# Patient Record
Sex: Male | Born: 1965 | Race: Asian | Hispanic: No | Smoking: Current every day smoker
Health system: Southern US, Community
[De-identification: ages and names within clinical notes are randomized; demographics above are authoritative.]

## PROBLEM LIST (undated history)

## (undated) DIAGNOSIS — G8929 Other chronic pain: Secondary | ICD-10-CM

## (undated) DIAGNOSIS — F419 Anxiety disorder, unspecified: Secondary | ICD-10-CM

## (undated) DIAGNOSIS — K219 Gastro-esophageal reflux disease without esophagitis: Secondary | ICD-10-CM

## (undated) DIAGNOSIS — B1920 Unspecified viral hepatitis C without hepatic coma: Secondary | ICD-10-CM

## (undated) DIAGNOSIS — M519 Unspecified thoracic, thoracolumbar and lumbosacral intervertebral disc disorder: Secondary | ICD-10-CM

## (undated) DIAGNOSIS — K589 Irritable bowel syndrome without diarrhea: Secondary | ICD-10-CM

## (undated) DIAGNOSIS — K259 Gastric ulcer, unspecified as acute or chronic, without hemorrhage or perforation: Secondary | ICD-10-CM

## (undated) DIAGNOSIS — M549 Dorsalgia, unspecified: Secondary | ICD-10-CM

## (undated) DIAGNOSIS — F32A Depression, unspecified: Secondary | ICD-10-CM

## (undated) DIAGNOSIS — F329 Major depressive disorder, single episode, unspecified: Secondary | ICD-10-CM

## (undated) DIAGNOSIS — E559 Vitamin D deficiency, unspecified: Secondary | ICD-10-CM

## (undated) DIAGNOSIS — R519 Headache, unspecified: Secondary | ICD-10-CM

## (undated) DIAGNOSIS — R51 Headache: Secondary | ICD-10-CM

## (undated) HISTORY — PX: INGUINAL HERNIA REPAIR: SUR1180

## (undated) HISTORY — PX: BACK SURGERY: SHX140

## (undated) HISTORY — PX: MULTIPLE TOOTH EXTRACTIONS: SHX2053

---

## 2005-04-13 ENCOUNTER — Encounter: Admission: RE | Admit: 2005-04-13 | Discharge: 2005-04-13 | Payer: Self-pay | Admitting: Unknown Physician Specialty

## 2008-08-26 ENCOUNTER — Emergency Department (HOSPITAL_COMMUNITY): Admission: EM | Admit: 2008-08-26 | Discharge: 2008-08-26 | Payer: Self-pay | Admitting: Family Medicine

## 2008-09-05 ENCOUNTER — Ambulatory Visit: Payer: Self-pay | Admitting: Gastroenterology

## 2008-09-24 ENCOUNTER — Ambulatory Visit: Payer: Self-pay | Admitting: Gastroenterology

## 2008-09-24 ENCOUNTER — Encounter: Payer: Self-pay | Admitting: Internal Medicine

## 2008-10-03 ENCOUNTER — Ambulatory Visit (HOSPITAL_COMMUNITY): Admission: RE | Admit: 2008-10-03 | Discharge: 2008-10-03 | Payer: Self-pay | Admitting: Gastroenterology

## 2008-10-03 ENCOUNTER — Encounter (INDEPENDENT_AMBULATORY_CARE_PROVIDER_SITE_OTHER): Payer: Self-pay | Admitting: Interventional Radiology

## 2008-10-10 ENCOUNTER — Encounter: Payer: Self-pay | Admitting: Internal Medicine

## 2008-10-10 ENCOUNTER — Ambulatory Visit: Payer: Self-pay | Admitting: Internal Medicine

## 2008-10-10 DIAGNOSIS — I10 Essential (primary) hypertension: Secondary | ICD-10-CM

## 2008-10-10 DIAGNOSIS — R079 Chest pain, unspecified: Secondary | ICD-10-CM

## 2008-10-10 LAB — CONVERTED CEMR LAB: Sed Rate: 2 mm/hr (ref 0–16)

## 2008-10-11 DIAGNOSIS — R5383 Other fatigue: Secondary | ICD-10-CM

## 2008-10-11 DIAGNOSIS — R5381 Other malaise: Secondary | ICD-10-CM

## 2008-10-15 ENCOUNTER — Ambulatory Visit (HOSPITAL_COMMUNITY): Admission: RE | Admit: 2008-10-15 | Discharge: 2008-10-15 | Payer: Self-pay | Admitting: Internal Medicine

## 2008-10-24 ENCOUNTER — Encounter: Payer: Self-pay | Admitting: Internal Medicine

## 2008-10-24 ENCOUNTER — Ambulatory Visit: Payer: Self-pay | Admitting: Internal Medicine

## 2008-10-24 LAB — CONVERTED CEMR LAB

## 2008-10-25 ENCOUNTER — Encounter: Payer: Self-pay | Admitting: Internal Medicine

## 2008-11-21 ENCOUNTER — Ambulatory Visit: Payer: Self-pay | Admitting: Infectious Diseases

## 2008-11-25 ENCOUNTER — Encounter: Payer: Self-pay | Admitting: Internal Medicine

## 2008-11-25 ENCOUNTER — Ambulatory Visit: Payer: Self-pay | Admitting: Internal Medicine

## 2008-11-25 LAB — CONVERTED CEMR LAB
Cholesterol: 191 mg/dL (ref 0–200)
HDL: 46 mg/dL (ref >39)
LDL Cholesterol: 116 mg/dL — ABNORMAL HIGH (ref 0–99)
Total CHOL/HDL Ratio: 4.2
Triglycerides: 147 mg/dL (ref <150)
VLDL: 29 mg/dL (ref 0–40)

## 2009-04-10 ENCOUNTER — Ambulatory Visit: Payer: Self-pay | Admitting: Internal Medicine

## 2009-04-10 DIAGNOSIS — K219 Gastro-esophageal reflux disease without esophagitis: Secondary | ICD-10-CM | POA: Insufficient documentation

## 2009-04-10 DIAGNOSIS — R1011 Right upper quadrant pain: Secondary | ICD-10-CM

## 2009-04-11 ENCOUNTER — Telehealth: Payer: Self-pay | Admitting: *Deleted

## 2009-04-15 ENCOUNTER — Ambulatory Visit (HOSPITAL_COMMUNITY): Admission: RE | Admit: 2009-04-15 | Discharge: 2009-04-15 | Payer: Self-pay | Admitting: Gastroenterology

## 2009-06-06 ENCOUNTER — Ambulatory Visit: Payer: Self-pay | Admitting: Gastroenterology

## 2009-06-06 LAB — CONVERTED CEMR LAB
ALT: 56 units/L — ABNORMAL HIGH (ref 0–53)
AST: 31 units/L (ref 0–37)
Albumin: 4 g/dL (ref 3.5–5.2)
Alkaline Phosphatase: 51 units/L (ref 39–117)
BUN: 10 mg/dL (ref 6–23)
CO2: 33 meq/L — ABNORMAL HIGH (ref 19–32)
Calcium: 9.3 mg/dL (ref 8.4–10.5)
Chloride: 102 meq/L (ref 96–112)
Creatinine, Ser: 0.7 mg/dL (ref 0.4–1.5)
GFR calc non Af Amer: 131.21 mL/min (ref >60)
Glucose, Bld: 80 mg/dL (ref 70–99)
Potassium: 4.8 meq/L (ref 3.5–5.1)
Sodium: 140 meq/L (ref 135–145)
Total Bilirubin: 0.4 mg/dL (ref 0.3–1.2)
Total Protein: 7.5 g/dL (ref 6.0–8.3)

## 2009-06-09 ENCOUNTER — Ambulatory Visit: Payer: Self-pay | Admitting: Gastroenterology

## 2009-06-09 LAB — CONVERTED CEMR LAB
Basophils Absolute: 0 10*3/uL (ref 0.0–0.1)
Basophils Relative: 1 % (ref 0–1)
Eosinophils Absolute: 0.1 10*3/uL (ref 0.0–0.7)
Eosinophils Relative: 1 % (ref 0–5)
HCT: 46.8 % (ref 39.0–52.0)
Hemoglobin: 16.2 g/dL (ref 13.0–17.0)
Lymphocytes Relative: 66 % — ABNORMAL HIGH (ref 12–46)
Lymphs Abs: 3.9 10*3/uL (ref 0.7–4.0)
MCHC: 34.6 g/dL (ref 30.0–36.0)
MCV: 75.7 fL — ABNORMAL LOW (ref 78.0–100.0)
Monocytes Absolute: 0.5 10*3/uL (ref 0.1–1.0)
Monocytes Relative: 9 % (ref 3–12)
Neutro Abs: 1.4 10*3/uL — ABNORMAL LOW (ref 1.7–7.7)
Neutrophils Relative %: 24 % — ABNORMAL LOW (ref 43–77)
Platelets: 200 10*3/uL (ref 150–400)
RBC: 6.18 M/uL — ABNORMAL HIGH (ref 4.22–5.81)
RDW: 14.7 % (ref 11.5–15.5)
WBC: 5.9 10*3/uL (ref 4.0–10.5)

## 2009-06-13 ENCOUNTER — Encounter: Payer: Self-pay | Admitting: Gastroenterology

## 2009-07-01 ENCOUNTER — Telehealth: Payer: Self-pay | Admitting: Gastroenterology

## 2009-07-17 ENCOUNTER — Ambulatory Visit: Payer: Self-pay | Admitting: Internal Medicine

## 2009-07-31 ENCOUNTER — Ambulatory Visit: Payer: Self-pay | Admitting: Gastroenterology

## 2009-08-11 ENCOUNTER — Telehealth: Payer: Self-pay | Admitting: Gastroenterology

## 2009-08-12 ENCOUNTER — Ambulatory Visit: Payer: Self-pay | Admitting: Internal Medicine

## 2009-08-12 DIAGNOSIS — K5900 Constipation, unspecified: Secondary | ICD-10-CM | POA: Insufficient documentation

## 2009-08-12 DIAGNOSIS — K2990 Gastroduodenitis, unspecified, without bleeding: Secondary | ICD-10-CM

## 2009-08-12 DIAGNOSIS — R110 Nausea: Secondary | ICD-10-CM | POA: Insufficient documentation

## 2009-08-12 DIAGNOSIS — R1084 Generalized abdominal pain: Secondary | ICD-10-CM | POA: Insufficient documentation

## 2009-08-12 DIAGNOSIS — K2970 Gastritis, unspecified, without bleeding: Secondary | ICD-10-CM | POA: Insufficient documentation

## 2009-09-12 ENCOUNTER — Ambulatory Visit: Payer: Self-pay | Admitting: Gastroenterology

## 2009-11-22 ENCOUNTER — Emergency Department (HOSPITAL_BASED_OUTPATIENT_CLINIC_OR_DEPARTMENT_OTHER): Admission: EM | Admit: 2009-11-22 | Discharge: 2009-11-22 | Payer: Self-pay | Admitting: Emergency Medicine

## 2009-11-22 ENCOUNTER — Ambulatory Visit: Payer: Self-pay | Admitting: Diagnostic Radiology

## 2009-12-09 ENCOUNTER — Ambulatory Visit: Payer: Self-pay | Admitting: Gastroenterology

## 2009-12-15 ENCOUNTER — Telehealth: Payer: Self-pay | Admitting: Gastroenterology

## 2009-12-17 ENCOUNTER — Emergency Department (HOSPITAL_COMMUNITY): Admission: EM | Admit: 2009-12-17 | Discharge: 2009-12-17 | Payer: Self-pay | Admitting: Family Medicine

## 2009-12-27 ENCOUNTER — Ambulatory Visit (HOSPITAL_COMMUNITY): Admission: RE | Admit: 2009-12-27 | Discharge: 2009-12-27 | Payer: Self-pay | Admitting: Unknown Physician Specialty

## 2010-02-07 ENCOUNTER — Emergency Department (HOSPITAL_COMMUNITY): Admission: EM | Admit: 2010-02-07 | Discharge: 2010-02-07 | Payer: Self-pay | Admitting: Emergency Medicine

## 2010-05-30 ENCOUNTER — Encounter: Payer: Self-pay | Admitting: Unknown Physician Specialty

## 2010-06-09 NOTE — Progress Notes (Signed)
Summary: meds not working  Phone Note Call from Patient Call back at [phone]   Caller: Patient Call For: Dr. Christella Hartigan Reason for Call: Talk to Nurse Summary of Call: pt reporting that abx is not working... doesnt recall the name of the drug Initial call taken by: Vallarie Mare,  July 01, 2009 2:39 PM  Follow-up for Phone Call        explained that the med can take up to 6 weeks to work and to call if no better in about 6 weeks.  Prilosec samples left at the front desk. Follow-up by: Chales Abrahams CMA Duncan Dull),  July 01, 2009 3:23 PM

## 2010-06-09 NOTE — Assessment & Plan Note (Signed)
  Review of gastrointestinal problems: 1. H. pylori positive moderat pan-gastritis on EGD January 2011. Treated with Prevpac type antibiotics.  Symptoms returned shortly after he stopped his antibiotics and so another course of antibiotics with Flagyl this time were started. He improved again.    History of Present Illness Visit Type: Follow-up Visit Primary GI MD: Rob Bunting MD Primary Provider: Clerance Lav MD Chief Complaint: abdominal pain History of Present Illness:     very pleasant 45 year old man whom I last saw in January the time of an EGD. See those results above. He was here one month ago, he completed the repeat course of abx (for poss recurrent H. pylori).  He feels definitely better.  He takes a centrum MVi.  Not taking miralax every day because it causes diarrhea.             Current Medications (verified): 1)  Neurontin 300 Mg Caps (Gabapentin) .... Take 1 Tablet By Mouth Two Times A Day 2)  Oxycodone Hcl 30 Mg Tabs (Oxycodone Hcl) .... Take One Tablet 4 Times Daily 3)  Dexilant 60 Mg Cpdr (Dexlansoprazole) .... Take 1 Capsule 30 Min Before Breakfast 4)  Alprazolam 0.5 Mg Tabs (Alprazolam) .... As Needed 5)  Multivitamins  Tabs (Multiple Vitamin) .... Once Daily  Allergies (verified): No Known Drug Allergies  Vital Signs:  Patient profile:   45 year old male Height:      67.5 inches Weight:      160 pounds BMI:     24.78 Pulse rate:   64 / minute Pulse rhythm:   regular BP sitting:   108 / 82  (left arm) Cuff size:   regular  Vitals Entered By: June McMurray CMA Duncan Dull) (Sep 12, 2009 3:12 PM)  Physical Exam  Additional Exam:  Constitutional: generally well appearing Psychiatric: alert and oriented times 3 Abdomen: soft, non-tender, non-distended, normal bowel sounds    Impression & Recommendations:  Problem # 1:  dyspepsia, H. pylori gastritis his symptoms have definitely improved since this second round of antibiotics to treat his H.  pylori he, biopsies confirmed. I explained there is still probably irritation, damage to his stomach to can take several more weeks to he'll. We have given samples of antiacid medicine I told him to take over-the-counter Prilosec once daily for now.  Patient Instructions: 1)  Samples of PPI given. 2)  Start OTC prilosec (take one pill, once a day, best taken 20-30 min  before  BF or dinner meal. 3)  Call Dr. Christella Hartigan' office as needed. 4)  The medication list was reviewed and reconciled.  All changed / newly prescribed medications were explained.  A complete medication list was provided to the patient / caregiver.  Appended Document:  prilosec samples Samples Given #6   -Lot Number:  [id number]

## 2010-06-09 NOTE — Progress Notes (Signed)
Summary: triage  Phone Note Call from Patient Call back at Home Phone [phone]   Caller: Patient Call For: Christella Hartigan Reason for Call: Talk to Nurse Summary of Call: Patient states that he is having a lot of stomach pain and was told to call us today if it did not go away with meds given to him, states that this weekeng the pain was severe. Initial call taken by: Tawni Levy,  August 11, 2009 9:22 AM  Follow-up for Phone Call        pt was schedueld with Amy for 08/12/09.   Follow-up by: Chales Abrahams CMA Duncan Dull),  August 11, 2009 9:56 AM

## 2010-06-09 NOTE — Assessment & Plan Note (Signed)
Summary: ROUTINE CK/EST/VS   Vital Signs:  Patient profile:   45 year old male Height:      67.5 inches (171.45 cm) Weight:      160.7 pounds (73.05 kg) BMI:     24.89 Temp:     98.2 degrees F oral Pulse rate:   90 / minute BP sitting:   125 / 75  (right arm)  Vitals Entered By: Chinita Pester RN (July 17, 2009 2:44 PM) CC: Pain right side which radiates to neck  and  head. Is Patient Diabetic? No Pain Assessment Patient in pain? yes     Location: neck/head Intensity: 5/6 Type: "SWELLING PAIN" Onset of pain  Intermittent Nutritional Status BMI of 19 -24 = normal  Have you ever been in a relationship where you felt threatened, hurt or afraid?No   Does patient need assistance? Functional Status Self care Ambulation Normal   Primary Care Provider:  Clerance Lav MD  CC:  Pain right side which radiates to neck  and  head.Marland Kitchen  History of Present Illness: 45 years old [place] men with past history of Hep C- 37,600 viral load, normal LFT, AFP of 3.3 (09/05/08) again presents with chronic severe pain in right back ribcage area and complain of weakness in both lower extremities upon waking up in the morning.  He continues to experience similar type of pain since last visit. He takes pain medications but needs it every few hours constantly. He does not like narcotic dependence but takes them off and on.  He was back in [place] but did not get any surgical intervention as he had planned earlier. He also has lot of financial strain.   He has been thinking about surgery and wants my opinion regarding the matter. He also wants to discuss alternative therapies as well as narcotic dependence.   Depression History:      The patient denies a depressed mood most of the day and a diminished interest in his usual daily activities.         Preventive Screening-Counseling & Management  Alcohol-Tobacco     Alcohol drinks/day: 0     Smoking Status: current     Smoking Cessation Counseling:  yes     Packs/Day: <1.0  Caffeine-Diet-Exercise     Does Patient Exercise: no  Current Medications (verified): 1)  Omeprazole 20 Mg Tbec (Omeprazole) .... Take 1 Tablet By Mouth Two Times A Day 2)  Neurontin 300 Mg Caps (Gabapentin) .... Take 1 Tablet By Mouth Two Times A Day 3)  Oxycodone Hcl 30 Mg Tabs (Oxycodone Hcl) .... Take One Tablet 4 Times Daily  Allergies (verified): No Known Drug Allergies  Past History:  Past Medical History: Last updated: 06/06/2009 Hep C- liver biopsy done HIV negative Chronic neck pain- parsthesias- likely had vertebral fusion at the age of 29 Hypertension    Past Surgical History: Last updated: 06/06/2009 vertebral fusion in neck  Family History: Last updated: 06/06/2009 Father had liver problem and experienced severe pain in the liver area, died in [place], no particular diagnosis known Mother alive has chronic health problems- not details available Siblings are doing fine One child healthy   Social History: Last updated: 06/06/2009 Recently divorced, still lives with ex-wife, trying to move out. Works at [employer] for [employer] 3/4 pack a day for last 20 years  does not drink alcohol  Risk Factors: Alcohol Use: 0 (07/17/2009) Exercise: no (07/17/2009)  Risk Factors: Smoking Status: current (07/17/2009) Packs/Day: <1.0 (07/17/2009)  Social  History: Packs/Day:  <1.0  Review of Systems      See HPI  Physical Exam  General:  Well-developed,well-nourished,in no acute distress; alert,appropriate and cooperative throughout examination Head:  Normocephalic and atraumatic without obvious abnormalities. No apparent alopecia or balding. Eyes:  No corneal or conjunctival inflammation noted. EOMI. Perrla. Vision grossly normal. Ears:  no external deformities.   Nose:  no external erythema.   Mouth:  pharynx pink and moist.   Neck:  No deformities, masses, or tenderness noted. no tingling or numbness upon cervical rib  exam- no cervical rib felt.  Lungs:  Normal respiratory effort, chest expands symmetrically. Lungs are clear to auscultation, no crackles or wheezes. Heart:  Normal rate and regular rhythm. S1 and S2 normal without gallop, murmur, click, rub or other extra sounds. Abdomen:  Bowel sounds positive,abdomen soft and non-tender without masses,   or hernias noted. ?enlarged liver edge Msk:  No deformity or scoliosis noted of thoracic or lumbar spine.  Other joints normal ROM, no erythema, no restriction in motion.  Generalised tenderness.  Neurologic:  No cranial nerve deficits noted. Station and gait are normal. Plantar reflexes are down-going bilaterally. DTRs are symmetrical throughout. Sensory, motor and coordinative functions appear intact. Skin:  Intact without suspicious lesions or rashes Psych:  Cognition and judgment appear intact. Alert and cooperative with normal attention span and concentration. No apparent delusions, illusions, hallucinations   Impression & Recommendations:  Problem # 1:  RIB PAIN (ICD-786.50) chronic pain which has been investigated with xray, usg and physical exam on multiple occassions and remains without etiology. He reports no releif in this particular type of pain even with oxycodone that releives his neck pain.  At this time, I do not think further investigation is warranted. We discussed possibility of referred pain and exam failed to reveal any particular etiology.  Problem # 2:  NECK PAIN, CHRONIC (ICD-723.1) Pt reports adequate releif with narcotics. I discussed his previous surgeries and their failure to resolve symptoms. Also he wants surgery done in [place], and it is difficult to assess what kind of surgery he might get and what outcome he should get. I recommended that he stay away from surgery for now and deal with chronic pain with help of pain clinic.  The following medications were removed from the medication list:    Tramadol Hcl 50 Mg Tabs (Tramadol  hcl) .Marland KitchenMarland KitchenMarland KitchenMarland Kitchen 50 mg every six hours as needed for pain  Problem # 3:  ESSENTIAL HYPERTENSION, BENIGN (ICD-401.1) BP normal right now. Previous high readings were likely secondary to pain. Not on any medicines.  BP today: 125/75 Prior BP: 120/80 (06/06/2009)  Labs Reviewed: K+: 4.8 (06/06/2009) Creat: : 0.7 (06/06/2009)   Chol: 191 (11/25/2008)   HDL: 46 (11/25/2008)   LDL: 116 (11/25/2008)   TG: 147 (11/25/2008)  Complete Medication List: 1)  Omeprazole 20 Mg Tbec (Omeprazole) .... Take 1 tablet by mouth two times a day 2)  Neurontin 300 Mg Caps (Gabapentin) .... Take 1 tablet by mouth two times a day 3)  Oxycodone Hcl 30 Mg Tabs (oxycodone Hcl)  .... Take one tablet 4 times daily  Patient Instructions: 1)  Please schedule a follow-up appointment in 6 months.   Prevention & Chronic Care Immunizations   Influenza vaccine: Not documented    Tetanus booster: 11/21/2008: Td    Pneumococcal vaccine: Not documented  Other Screening   Smoking status: current  (07/17/2009)   Smoking cessation counseling: yes  (07/17/2009)  Lipids   Total Cholesterol:  191  (11/25/2008)   Lipid panel action/deferral: Lipid Panel ordered   LDL: 116  (11/25/2008)   LDL Direct: Not documented   HDL: 46  (11/25/2008)   Triglycerides: 147  (11/25/2008)  Hypertension   Last Blood Pressure: 125 / 75  (07/17/2009)   Serum creatinine: 0.7  (06/06/2009)   Serum potassium 4.8  (06/06/2009)  Self-Management Support :   Personal Goals (by the next clinic visit) :      Personal blood pressure goal: 140/90  (11/21/2008)   Patient will work on the following items until the next clinic visit to reach self-care goals:     Medications and monitoring: check my blood pressure  (07/17/2009)     Eating: eat more vegetables, use fresh or frozen vegetables, eat foods that are low in salt  (11/21/2008)     Activity: take a 30 minute walk every day  (07/17/2009)    Hypertension self-management support: Education  handout, Resources for patients handout  (07/17/2009)   Hypertension education handout printed      Resource handout printed.

## 2010-06-09 NOTE — Procedures (Signed)
Summary: Upper Endoscopy  Patient: Brandon Ali Note: All result statuses are Final unless otherwise noted.  Tests: (1) Upper Endoscopy (EGD)   EGD Upper Endoscopy       DONE     [facility]     [address]     [place], [place]  [address]           ENDOSCOPY PROCEDURE REPORT           PATIENT:  Brandon Ali, Brandon Ali  MR#:  [id number]     BIRTHDATE:  [birth year], 43 yrs. old  GENDER:  male           ENDOSCOPIST:  Rachael Fee, MD     Referred by:  Jarvis Newcomer, MD           PROCEDURE DATE:  06/09/2009     PROCEDURE:  EGD with biopsy     ASA CLASS:  Class II     INDICATIONS:  abd pain, dyspepsia           MEDICATIONS:   Fentanyl 50 mcg IV, Versed 5 mg IV     TOPICAL ANESTHETIC:  Exactacain Spray           DESCRIPTION OF PROCEDURE:   After the risks benefits and     alternatives of the procedure were thoroughly explained, informed     consent was obtained.  The LB [id number] endoscope was     introduced through the mouth and advanced to the second portion of     the duodenum, without limitations.  The instrument was slowly     withdrawn as the mucosa was fully examined.     <<PROCEDUREIMAGES>>           There was mild to moderate pan-gastritis. Biopsies were taken to     check for H. pylori (pathology jar 1) (see image6, image7, and     image8).  A nodule was found. There was a small (1cm),     umbilicated, subepithelial nodule in distal stomach. Biopsies were     taken (jar 2) (see image5 and image4).  Otherwise the examination     was normal (see image3, image2, and image1).    Retroflexed views     revealed no abnormalities.    The scope was then withdrawn from     the patient and the procedure completed.           COMPLICATIONS:  None           ENDOSCOPIC IMPRESSION:     1) Moderate gastritis, biopsied to check for H. pylori.     2) Small, umbilicated distal stomach nodule.  Biopsies taken.     3) Otherwise normal examination           RECOMMENDATIONS:     If  biopsies show H. pylori, then he will be started on the     appropriate antibiotics.  If not, will have to consider further     imaging (perhaps HIDA scan given RUQ nature of his pains).           ______________________________     Rachael Fee, MD           n.     eSIGNED:   Rachael Fee at 06/09/2009 02:22 PM           Lucy Antigua, [id number]  Note: An exclamation mark (!) indicates a result that was not dispersed into the flowsheet. Document Creation Date: 06/09/2009  2:22 PM _______________________________________________________________________  (1) Order result status: Final Collection or observation date-time: 06/09/2009 14:16 Requested date-time:  Receipt date-time:  Reported date-time:  Referring Physician:   Ordering Physician: Rob Bunting [phone]) Specimen Source:  Source: Launa Grill Order Number: [id number] Lab site:

## 2010-06-09 NOTE — Assessment & Plan Note (Signed)
History of Present Illness Visit Type: consult Primary GI MD: Rob Bunting MD Primary Provider: Clerance Lav MD Chief Complaint: GERD, RUQ pain History of Present Illness:     very pleasant 45 year old [place] born man who has had problems for at least 5 years.  He has AM swelling of abd, bloated.  Also facial swelling.  He get epigastric pain, usually after eating 2-3 times a week. The pain can last .  He has taken pepcid for it, will often help.  prevacid did not really help.  He was given a liquid at an urgent care and he thinks it helped. The pain feels like a little ball is stuck.   He also has a right sided abd pains.  he has chronic hepatitis C which he has been told is not active. He had a recent abdominal ultrasound showing a normal liver, normal gallbladder. She is scheduled to see medical specialty clinic to consider treating his hepatitis C  he has been on omeprazole daily for about a month without change.  he has mouth ulcers at time.  water feels like it goes down esophagus slowly.  Weight is overall stable.  he takes oxycodone 15mg  5 times a day for neck pains. he has been on this for 4 years or so.  Also on neuronitic for this pain.  Very rare NSAIDs.   No vomitting of blood.             Current Medications (verified): 1)  Tramadol Hcl 50 Mg Tabs (Tramadol Hcl) .... 50 Mg Every Six Hours As Needed For Pain 2)  Omeprazole 20 Mg Tbec (Omeprazole) .... Take 1 Tablet By Mouth Two Times A Day 3)  Neurontin 300 Mg Caps (Gabapentin) .... Take 1 Tablet By Mouth Two Times A Day 4)  Oxycodone Hcl 15 Mg Tabs (Oxycodone Hcl) .... Take One Tablet 5 Times Daily  Allergies (verified): No Known Drug Allergies  Past History:  Past Medical History: Hep C- liver biopsy done HIV negative Chronic neck pain- parsthesias- likely had vertebral fusion at the age of 59 Hypertension    Past Surgical History: vertebral fusion in neck  Family History: Father had  liver problem and experienced severe pain in the liver area, died in [place], no particular diagnosis known Mother alive has chronic health problems- not details available Siblings are doing fine One child healthy   Social History: Recently divorced, still lives with ex-wife, trying to move out. Works at [employer] for [employer] 3/4 pack a day for last 20 years  does not drink alcohol  Review of Systems       Pertinent positive and negative review of systems were noted in the above HPI and GI specific review of systems.  All other review of systems was otherwise negative.   Vital Signs:  Patient profile:   45 year old male Height:      67.5 inches Weight:      159 pounds BMI:     24.62 Pulse rate:   84 / minute Pulse rhythm:   regular BP sitting:   120 / 80  (left arm) Cuff size:   regular  Vitals Entered By: Francee Piccolo CMA Duncan Dull) (June 06, 2009 3:09 PM)  Physical Exam  Additional Exam:  Constitutional: generally well appearing Psychiatric: alert and oriented times 3 Eyes: extraocular movements intact Mouth: oropharynx moist, no lesions Neck: supple, no lymphadenopathy Cardiovascular: heart regular rate and rythm Lungs: CTA bilaterally Abdomen: soft, non-tender, non-distended, no obvious  ascites, no peritoneal signs, normal bowel sounds Extremities: no lower extremity edema bilaterally Skin: no lesions on visible extremities    Impression & Recommendations:  Problem # 1:  epigastric discomfort GERD-like symptoms, right upper quadrant discomfort this may be biliary in nature however no gallstones were seen on ultrasound recently and his gallbladder was otherwise normal. Perhaps he has peptic ulcer disease, gastritis, GERD associated dyspepsia. He has tried antiacid medicines in the past however I will give him samples of another prescription strength antiacid medicine which she will take one pill 20 to 30s prior to his dinner meal daily. We will set  him up with EGD at his soonest convenience and he will get repeat labs including a CBC and a complete metabolic profile.  Other Orders: TLB-CBC Platelet - w/Differential (85025-CBCD) TLB-CMP (Comprehensive Metabolic Pnl) (80053-COMP)  Patient Instructions: 1)  You will get lab test(s) done today (cbc, cmet). 2)  You will be scheduled to have an upper endoscopy. 3)  Samples of PPI given, take one pill 20-30 min before dinner meal daily. 4)  A copy of this information will be sent to Dr. Sherryll Burger. 5)  The medication list was reviewed and reconciled.  All changed / newly prescribed medications were explained.  A complete medication list was provided to the patient / caregiver.   Appended Document: Orders Update/EGD    Clinical Lists Changes  Orders: Added new Test order of EGD (EGD) - Signed

## 2010-06-09 NOTE — Miscellaneous (Signed)
Summary: H pylori  Clinical Lists Changes  Medications: Changed medication from [facility]  MISC (AMOXICILL-CLARITHRO-LANSOPRAZ) as directed to AMOXICILLIN 500 MG CAPS (AMOXICILLIN) 2 by mouth two times a day  x 10 days - Signed Added new medication of CLARITHROMYCIN 500 MG TABS (CLARITHROMYCIN) 1 by mouth two times a day x 10 days - Signed Rx of AMOXICILLIN 500 MG CAPS (AMOXICILLIN) 2 by mouth two times a day  x 10 days;  #40 x 0;  Signed;  Entered by: Chales Abrahams CMA (AAMA);  Authorized by: Rachael Fee MD;  Method used: Electronically to [facility] [phone]*, [address] [place], [place]  [address], Ph: [phone], Fax: [phone] Rx of CLARITHROMYCIN 500 MG TABS (CLARITHROMYCIN) 1 by mouth two times a day x 10 days;  #20 x 0;  Signed;  Entered by: Chales Abrahams CMA (AAMA);  Authorized by: Rachael Fee MD;  Method used: Electronically to [facility] [phone]*, [address], [place], [place]  [address], Ph: [phone], Fax: [phone]    Prescriptions: CLARITHROMYCIN 500 MG TABS (CLARITHROMYCIN) 1 by mouth two times a day x 10 days  #20 x 0   Entered by:   Chales Abrahams CMA (AAMA)   Authorized by:   Rachael Fee MD   Signed by:   Chales Abrahams CMA (AAMA) on 06/13/2009   Method used:   Electronically to        [facility] [phone]* (retail)       [address]       [place], [place]  [address]       Ph: [phone]       Fax: [phone]   RxID:   [id number] AMOXICILLIN 500 MG CAPS (AMOXICILLIN) 2 by mouth two times a day  x 10 days  #40 x 0   Entered by:   Chales Abrahams CMA (AAMA)   Authorized by:   Rachael Fee MD   Signed by:   Chales Abrahams CMA (AAMA) on 06/13/2009   Method used:   Electronically to        [facility] [phone]* (retail)       [address]       [place], [place]  [address]       Ph: [phone]       Fax: [phone]   RxID:   [id number]   Appended Document: H pylori pt could not afford the Prevpac.  pt was instructed to take the  omeprazole as well two times a day

## 2010-06-09 NOTE — Assessment & Plan Note (Signed)
Summary: worsening abdominal pain/pl     Brandon Ali pt   History of Present Illness Visit Type: Follow-up Visit Primary GI MD: Rob Bunting MD Primary Provider: Clerance Lav MD Chief Complaint: Patient complains of worsening abdominal pain, he also states that he has some constipation.  History of Present Illness:   43 [place] MALE KNOWN TO DR. [name] . PT HAD EVAL EARLIER THIS YEAR FOR EPIGASTRIC PAIN,BURNING AND WAS FOUND TO HAVE CHRONIC GASTRITIS ON EGD. BX + FOR H.PYLORI . HE WAS TREATED WITH BIAXIN, AND AMOXICILLIN X 10 DAYS,IN ADDITION TO OMEPRAZOLE TWICE DAILY X 10 DAYS. HE SAYS HE FELT ALOT BETTER WITH THAT REGIMEN,AND HIS BOWELS MOVED BETTER AS WELL. HE COMES IN NOW WITH 2 WEEKS OF RECURRENT DISCOMFORT,RATHER GENERALIZED,WITH BLOATING,UPPER ABDOMINAL BURNING. HE IS ALSO CONSTIPATED-SAYS HE HAS PELLET LIKE STOOLS.Marland KitchenHE IS CONVINCED HE NEEDS THE ANTIBIOTICS AGAIN.   GI Review of Systems    Reports abdominal pain, bloating, and  nausea.     Location of  Abdominal pain: generalized.    Denies acid reflux, belching, chest pain, dysphagia with liquids, dysphagia with solids, heartburn, loss of appetite, vomiting, vomiting blood, and  weight loss.      Reports constipation.     Denies anal fissure, black tarry stools, change in bowel habit, diarrhea, diverticulosis, fecal incontinence, heme positive stool, hemorrhoids, irritable bowel syndrome, jaundice, light color stool, liver problems, rectal bleeding, and  rectal pain.    Current Medications (verified): 1)  Omeprazole 20 Mg Tbec (Omeprazole) .... Take One By Mouth As Needed 2)  Neurontin 300 Mg Caps (Gabapentin) .... Take 1 Tablet By Mouth Two Times A Day 3)  Oxycodone Hcl 30 Mg Tabs (Oxycodone Hcl) .... Take One Tablet 4 Times Daily  Allergies (verified): No Known Drug Allergies  Past History:  Past Medical History: Hep C- liver biopsy done HIV negative Chronic neck pain- parasthesias- likely had vertebral fusion at the age of  85 Hypertension   chronic gastritis/h.pylori 2/11  Past Surgical History: Reviewed history from 06/06/2009 and no changes required. vertebral fusion in neck  Family History: Reviewed history from 06/06/2009 and no changes required. Father had liver problem and experienced severe pain in the liver area, died in [place], no particular diagnosis known Mother alive has chronic health problems- not details available Siblings are doing fine One child healthy   Social History: Reviewed history from 06/06/2009 and no changes required. Recently divorced, still lives with ex-wife, trying to move out. Works at [employer] for [employer] 3/4 pack a day for last 20 years  does not drink alcohol  Review of Systems       The patient complains of fever, muscle pains/cramps, shortness of breath, sleeping problems, and swelling of feet/legs.  The patient denies allergy/sinus, anemia, anxiety-new, arthritis/joint pain, back pain, blood in urine, breast changes/lumps, change in vision, confusion, cough, coughing up blood, depression-new, fainting, fatigue, headaches-new, hearing problems, heart murmur, heart rhythm changes, itching, menstrual pain, night sweats, nosebleeds, pregnancy symptoms, skin rash, sore throat, swollen lymph glands, thirst - excessive , urination - excessive , urination changes/pain, urine leakage, vision changes, and voice change.         ros otherwise as in hpi  Vital Signs:  Patient profile:   45 year old male Height:      67.5 inches Weight:      159.0 pounds BMI:     24.62 Pulse rate:   80 / minute Pulse rhythm:   regular BP sitting:   110 /  80  (left arm) Cuff size:   regular  Vitals Entered By: Harlow Mares CMA Duncan Dull) (August 12, 2009 1:38 PM)  Physical Exam  General:  Well developed, well nourished, no acute distress. Head:  Normocephalic and atraumatic. Eyes:  PERRLA, no icterus. Lungs:  Clear throughout to auscultation. Heart:  Regular rate and  rhythm; no murmurs, rubs,  or bruits. Abdomen:  SOFT, NO FOCAL TENDERNESS, NO MASS OR HSM,BS+ Rectal:  NOT DONE Extremities:  No clubbing, cyanosis, edema or deformities noted. Neurologic:  Alert and  oriented x4;  grossly normal neurologically. Psych:  Alert and cooperative. Normal mood and affect.anxious.     Impression & Recommendations:  Problem # 1:  ABDOMINAL PAIN -GENERALIZED (ICD-789.07) Assessment Deteriorated 45 YO MALE WITH RECENT RX FOR H.PYLORI ,CHRONIC GASTRITIS;WITH RECURRENT SXS, AND IN ADDITION CHRONIC CONSTIPATION-NARCOTIC INDUCED. I AM NOT CONVINCED HE HAS PERSISTENT H.PYLORI,BUT MAY HAVE PERSISTENT GASTRITIS   WILL ALLOW 10 DAY COURSE OF FLAGYL 500 MG TWICE DAILY X 10 DAYS,AND AMOXICILLIN 500MG  TWICE DAILY X 10 DAYS TRIAL OF DEXILANT 60 MG ONCE DAILY X 3 WEEKS-SAMPLES PROVIDED MIRALAX 17 GM DAILY IN WATER -EVERY DAY FOLLOW UP WITH DR. [name] IN 3-4 WEEKS.  Problem # 2:  HEPATITIS C (ICD-070.51) Assessment: Comment Only BEING FOLLOWED AT HEP C CLINIC  Problem # 3:  NECK PAIN, CHRONIC (ICD-723.1) Assessment: Comment Only CHRONIC NARCOTIC USE,AGGRAVATING GI SXS.  Patient Instructions: 1)  We sent prescriptions for Flagyl and Amoxicillin to your pharmacy. 2)  Take Miralax daily, samples and coupons daily. 3)  We have given you samples of Dexilant, take 1 capsule 30 min prior to breakfast. 4)  Make appointment to see Dr. [name] in 4 weeks.  5)  Copy sent to : Toniann Ket, MD 6)  The medication list was reviewed and reconciled.  All changed / newly prescribed medications were explained.  A complete medication list was provided to the patient / caregiver. Prescriptions: AMOXICILLIN 500 MG CAPS (AMOXICILLIN) Tale 2 tab twice daily x 10 days  #20 x 0   Entered by:   Lowry Ram NCMA   Authorized by:   Sammuel Cooper PA-c   Signed by:   Lowry Ram NCMA on 08/12/2009   Method used:   Electronically to        [facility] [phone]* (retail)       [address]       [place], [place]  [address]       Ph: [phone]       Fax: [phone]   RxID:   [id number] FLAGYL 500 MG TABS (METRONIDAZOLE) Take 1 tab twice daily  #20 x 0   Entered by:   Lowry Ram NCMA   Authorized by:   Sammuel Cooper PA-c   Signed by:   Lowry Ram NCMA on 08/12/2009   Method used:   Electronically to        [facility] [phone]* (retail)       [address]       [place], [place]  [address]       Ph: [phone]       Fax: [phone]   RxID:   [id number]

## 2010-06-09 NOTE — Assessment & Plan Note (Signed)
Review of gastrointestinal problems: 1. H. pylori positive moderat pan-gastritis on EGD January 2011. Treated with Prevpac type antibiotics.  Symptoms returned shortly after he stopped his antibiotics and so another course of antibiotics with Flagyl this time were started. He improved again.  History of Present Illness:      very pleasant 45 year old man whom I last saw several months ago.  has been having pains in right flank.  The pain is constant.  The right flank pain is improved with antibiotics.  HE wen tto ER 2 weeks ago for the pain.  His primary care physician has evaluated this flank pain several times. Imaging studies have been unrevealing. Ultrasound shows a normal gallbladder.           Current Medications (verified): 1)  Neurontin 300 Mg Caps (Gabapentin) .... Take 1 Tablet By Mouth Two Times A Day 2)  Oxycodone Hcl 30 Mg Tabs (Oxycodone Hcl) .... Take One Tablet 4 Times Daily 3)  Aciphex 20 Mg Tbec (Rabeprazole Sodium) .... Take 1 Tablet By Mouth Once A Day 4)  Alprazolam 0.5 Mg Tabs (Alprazolam) .... As Needed 5)  Multivitamins  Tabs (Multiple Vitamin) .... Once Daily 6)  Ranitidine Hcl 150 Mg Caps (Ranitidine Hcl) .... Take 1 Capsule By Mouth Once A Day As Needed  Allergies (verified): No Known Drug Allergies  Vital Signs:  Patient profile:   45 year old male Height:      67.5 inches Weight:      150 pounds BMI:     23.23 Pulse rate:   76 / minute Pulse rhythm:   regular BP sitting:   108 / 74  (left arm) Cuff size:   regular  Vitals Entered By: Brandon Ali CMA Brandon Ali) (December 09, 2009 1:47 PM)  Physical Exam  Additional Exam:  Constitutional: generally well appearing Psychiatric: alert and oriented times 3 Abdomen: soft, non-tender, non-distended, normal bowel sounds    Impression & Recommendations:  Problem # 1:  hepatitis C he was supposed to have a visit at the medical specialties clinic earlier this spring however they canceled that  appointment. Perhaps some of his right sided abdominal, flank pains are related to his chronic hepatitis. We will call the specialties clinic to see if we can get him back in.  Problem # 2:  dyspepsia he continues to have intermittent stomach discomforts. He swears that antibiotics clearly helped his discomforts. He has been treated twice for H. pylori. I'll try him on Carafate and he will return to see me in 6 weeks.  Patient Instructions: 1)  We will call the medical specialties clinic about hepatitis C referal. Was supposed to be seen this past spring.  2)  Carafate trial, called into your pharmacy.  Take twice daily. 3)  Return to see Brandon Ali in 6 weeks. 4)  The medication list was reviewed and reconciled.  All changed / newly prescribed medications were explained.  A complete medication list was provided to the patient / caregiver. Prescriptions: CARAFATE 1 GM/10ML  SUSP (SUCRALFATE) take 1 gram twice daily  #1 month x 3   Entered and Authorized by:   Brandon Fee MD   Signed by:   Brandon Fee MD on 12/09/2009   Method used:   Electronically to        [facility] [phone]* (retail)       [address]       [place], [place]  [address]       Ph: [phone]  Fax: [phone]   RxID:   [id number]   Appended Document:  called the Hep c clinic numerous times the phone just rings I will try later  Appended Document:  no answer and unable to leave a message  Appended Document: Orders Update/Hep C  [phone] new number   Clinical Lists Changes  Orders: Added new Test order of [facility]) - Signed      Appended Document:  left message on machine to call back   Appended Document:  left message on machine to call back

## 2010-06-09 NOTE — Progress Notes (Signed)
Summary: liver specialist referral?  Phone Note Call from Patient Call back at [phone]   Caller: [place], wife Call For: Dr. Christella Hartigan Reason for Call: Talk to Nurse Summary of Call: wife following up on referral to liver specialist Initial call taken by: Vallarie Mare,  December 15, 2009 12:53 PM  Follow-up for Phone Call        explained to the wife that I have sent the records to the Hep B clinic and have not gotten a response.  I have also placed numerous calls with no response.  I will continue to work on the appt and call back. Follow-up by: Chales Abrahams CMA Duncan Dull),  December 15, 2009 1:19 PM     Appended Document: liver specialist referral? refaxed all records and referral form  Appended Document: liver specialist referral? pt is on a wait list for appt

## 2010-06-09 NOTE — Letter (Signed)
Summary: EGD Instructions  [facility]  [address] [place], [place] [address]   Phone: [phone]  Fax: [phone]       Brandon Ali    May 05, 1966    MRN: [id number]       Procedure Day /Date:06/09/09     Arrival Time: 1:00 pm     Procedure Time:2:00 pm     Location of Procedure:                    [facility] (4th Floor)    PREPARATION FOR ENDOSCOPY   On1/31/11 THE DAY OF THE PROCEDURE:  1.   No solid foods, milk or milk products are allowed after midnight the night before your procedure.  2.   Do not drink anything colored red or purple.  Avoid juices with pulp.  No orange juice.  3.  You may drink clear liquids until12 noon, which is 2 hours before your procedure.                                                                                                CLEAR LIQUIDS INCLUDE: Water Jello Ice Popsicles Tea (sugar ok, no milk/cream) Powdered fruit flavored drinks Coffee (sugar ok, no milk/cream) Gatorade Juice: apple, white grape, white cranberry  Lemonade Clear bullion, consomm, broth Carbonated beverages (any kind) Strained chicken noodle soup Hard Candy   MEDICATION INSTRUCTIONS  Unless otherwise instructed, you should take regular prescription medications with a small sip of water as early as possible the morning of your procedure.               OTHER INSTRUCTIONS  You will need a responsible adult at least 44 years of age to accompany you and drive you home.   This person must remain in the waiting room during your procedure.  Wear loose fitting clothing that is easily removed.  Leave jewelry and other valuables at home.  However, you may wish to bring a book to read or an iPod/MP3 player to listen to music as you wait for your procedure to start.  Remove all body piercing jewelry and leave at home.  Total time from sign-in until discharge is approximately 2-3 hours.  You should go home directly after your  procedure and rest.  You can resume normal activities the day after your procedure.  The day of your procedure you should not:   Drive   Make legal decisions   Operate machinery   Drink alcohol   Return to work  You will receive specific instructions about eating, activities and medications before you leave.    The above instructions have been reviewed and explained to me by   _______________________    I fully understand and can verbalize these instructions _____________________________ Date _________

## 2010-07-23 LAB — URINALYSIS, ROUTINE W REFLEX MICROSCOPIC
Bilirubin Urine: NEGATIVE
Glucose, UA: NEGATIVE mg/dL
Hgb urine dipstick: NEGATIVE
Ketones, ur: NEGATIVE mg/dL
Nitrite: NEGATIVE
Protein, ur: NEGATIVE mg/dL
Specific Gravity, Urine: 1.004 — ABNORMAL LOW (ref 1.005–1.030)
Urobilinogen, UA: 0.2 mg/dL (ref 0.0–1.0)
pH: 7 (ref 5.0–8.0)

## 2010-07-23 LAB — DIFFERENTIAL
Basophils Absolute: 0 10*3/uL (ref 0.0–0.1)
Basophils Relative: 0 % (ref 0–1)
Eosinophils Absolute: 0 10*3/uL (ref 0.0–0.7)
Eosinophils Relative: 0 % (ref 0–5)
Lymphocytes Relative: 55 % — ABNORMAL HIGH (ref 12–46)
Lymphs Abs: 2.7 10*3/uL (ref 0.7–4.0)
Monocytes Absolute: 0.4 10*3/uL (ref 0.1–1.0)
Monocytes Relative: 7 % (ref 3–12)
Neutro Abs: 1.9 10*3/uL (ref 1.7–7.7)
Neutrophils Relative %: 37 % — ABNORMAL LOW (ref 43–77)

## 2010-07-23 LAB — POCT I-STAT, CHEM 8
BUN: 7 mg/dL (ref 6–23)
Calcium, Ion: 1.16 mmol/L (ref 1.12–1.32)
Chloride: 99 mEq/L (ref 96–112)
Creatinine, Ser: 0.8 mg/dL (ref 0.4–1.5)
Glucose, Bld: 92 mg/dL (ref 70–99)
HCT: 52 % (ref 39.0–52.0)
Hemoglobin: 17.7 g/dL — ABNORMAL HIGH (ref 13.0–17.0)
Potassium: 4.7 mEq/L (ref 3.5–5.1)
Sodium: 135 mEq/L (ref 135–145)
TCO2: 29 mmol/L (ref 0–100)

## 2010-07-23 LAB — LIPASE, BLOOD: Lipase: 23 U/L (ref 11–59)

## 2010-07-23 LAB — CBC
HCT: 45.5 % (ref 39.0–52.0)
Hemoglobin: 16 g/dL (ref 13.0–17.0)
MCH: 26.4 pg (ref 26.0–34.0)
MCHC: 35.2 g/dL (ref 30.0–36.0)
MCV: 75.1 fL — ABNORMAL LOW (ref 78.0–100.0)
Platelets: 210 10*3/uL (ref 150–400)
RBC: 6.06 MIL/uL — ABNORMAL HIGH (ref 4.22–5.81)
RDW: 14.4 % (ref 11.5–15.5)
WBC: 5 10*3/uL (ref 4.0–10.5)

## 2010-07-25 LAB — BASIC METABOLIC PANEL
BUN: 10 mg/dL (ref 6–23)
CO2: 29 mEq/L (ref 19–32)
Calcium: 9.2 mg/dL (ref 8.4–10.5)
Chloride: 101 mEq/L (ref 96–112)
Creatinine, Ser: 0.8 mg/dL (ref 0.4–1.5)
GFR calc Af Amer: >60 mL/min (ref >60)
GFR calc non Af Amer: >60 mL/min (ref >60)
Glucose, Bld: 91 mg/dL (ref 70–99)
Potassium: 4 mEq/L (ref 3.5–5.1)
Sodium: 138 mEq/L (ref 135–145)

## 2010-07-25 LAB — DIFFERENTIAL
Basophils Absolute: 0.1 10*3/uL (ref 0.0–0.1)
Basophils Relative: 1 % (ref 0–1)
Eosinophils Absolute: 0.1 10*3/uL (ref 0.0–0.7)
Eosinophils Relative: 2 % (ref 0–5)
Lymphocytes Relative: 63 % — ABNORMAL HIGH (ref 12–46)
Lymphs Abs: 3.7 10*3/uL (ref 0.7–4.0)
Monocytes Absolute: 0.4 10*3/uL (ref 0.1–1.0)
Monocytes Relative: 7 % (ref 3–12)
Neutro Abs: 1.6 10*3/uL — ABNORMAL LOW (ref 1.7–7.7)
Neutrophils Relative %: 27 % — ABNORMAL LOW (ref 43–77)

## 2010-07-25 LAB — CBC
HCT: 45.4 % (ref 39.0–52.0)
Hemoglobin: 14.6 g/dL (ref 13.0–17.0)
MCH: 25.7 pg — ABNORMAL LOW (ref 26.0–34.0)
MCHC: 32.3 g/dL (ref 30.0–36.0)
MCV: 79.5 fL (ref 78.0–100.0)
Platelets: 194 10*3/uL (ref 150–400)
RBC: 5.71 MIL/uL (ref 4.22–5.81)
RDW: 14 % (ref 11.5–15.5)
WBC: 5.9 10*3/uL (ref 4.0–10.5)

## 2010-07-25 LAB — POCT CARDIAC MARKERS
CKMB, poc: <1 ng/mL — ABNORMAL LOW (ref 1.0–8.0)
Myoglobin, poc: 46.5 ng/mL (ref 12–200)
Troponin i, poc: <0.05 ng/mL (ref 0.00–0.09)

## 2010-07-25 LAB — URINALYSIS, ROUTINE W REFLEX MICROSCOPIC
Bilirubin Urine: NEGATIVE
Glucose, UA: NEGATIVE mg/dL
Hgb urine dipstick: NEGATIVE
Ketones, ur: NEGATIVE mg/dL
Nitrite: NEGATIVE
Protein, ur: NEGATIVE mg/dL
Specific Gravity, Urine: 1.017 (ref 1.005–1.030)
Urobilinogen, UA: 1 mg/dL (ref 0.0–1.0)
pH: 7 (ref 5.0–8.0)

## 2010-08-18 LAB — PROTIME-INR
INR: 1.1 (ref 0.00–1.49)
Prothrombin Time: 14.4 seconds (ref 11.6–15.2)

## 2010-08-18 LAB — CBC
HCT: 45.3 % (ref 39.0–52.0)
Hemoglobin: 14.9 g/dL (ref 13.0–17.0)
MCHC: 33 g/dL (ref 30.0–36.0)
MCV: 79.9 fL (ref 78.0–100.0)
Platelets: 175 10*3/uL (ref 150–400)
RBC: 5.67 MIL/uL (ref 4.22–5.81)
RDW: 14.6 % (ref 11.5–15.5)
WBC: 4.6 10*3/uL (ref 4.0–10.5)

## 2010-08-19 LAB — COMPREHENSIVE METABOLIC PANEL
ALT: 43 U/L (ref 0–53)
AST: 26 U/L (ref 0–37)
CO2: 25 mEq/L (ref 19–32)
Calcium: 9.7 mg/dL (ref 8.4–10.5)
Chloride: 103 mEq/L (ref 96–112)
Creatinine, Ser: 0.75 mg/dL (ref 0.4–1.5)
GFR calc Af Amer: >60 mL/min (ref >60)
GFR calc non Af Amer: >60 mL/min (ref >60)
Glucose, Bld: 111 mg/dL — ABNORMAL HIGH (ref 70–99)
Total Bilirubin: 0.5 mg/dL (ref 0.3–1.2)

## 2010-08-19 LAB — DIFFERENTIAL
Basophils Absolute: 0 10*3/uL (ref 0.0–0.1)
Eosinophils Absolute: 0.1 10*3/uL (ref 0.0–0.7)
Eosinophils Relative: 1 % (ref 0–5)
Lymphs Abs: 2.9 10*3/uL (ref 0.7–4.0)
Neutrophils Relative %: 39 % — ABNORMAL LOW (ref 43–77)

## 2010-08-19 LAB — CBC
Hemoglobin: 16.6 g/dL (ref 13.0–17.0)
MCHC: 33.7 g/dL (ref 30.0–36.0)
MCV: 79.6 fL (ref 78.0–100.0)
RBC: 6.19 MIL/uL — ABNORMAL HIGH (ref 4.22–5.81)
WBC: 5.5 10*3/uL (ref 4.0–10.5)

## 2010-08-19 LAB — LIPASE, BLOOD: Lipase: 20 U/L (ref 11–59)

## 2011-02-06 DIAGNOSIS — M542 Cervicalgia: Secondary | ICD-10-CM | POA: Insufficient documentation

## 2011-02-06 DIAGNOSIS — F329 Major depressive disorder, single episode, unspecified: Secondary | ICD-10-CM | POA: Insufficient documentation

## 2011-02-06 DIAGNOSIS — B182 Chronic viral hepatitis C: Secondary | ICD-10-CM | POA: Insufficient documentation

## 2011-04-06 ENCOUNTER — Emergency Department (INDEPENDENT_AMBULATORY_CARE_PROVIDER_SITE_OTHER): Payer: Self-pay

## 2011-04-06 ENCOUNTER — Emergency Department (HOSPITAL_BASED_OUTPATIENT_CLINIC_OR_DEPARTMENT_OTHER)
Admission: EM | Admit: 2011-04-06 | Discharge: 2011-04-07 | Disposition: A | Discharge status: Home / Self Care | Payer: Self-pay | Attending: Emergency Medicine | Admitting: Emergency Medicine

## 2011-04-06 ENCOUNTER — Encounter: Payer: Self-pay | Admitting: Emergency Medicine

## 2011-04-06 DIAGNOSIS — R1011 Right upper quadrant pain: Secondary | ICD-10-CM | POA: Insufficient documentation

## 2011-04-06 DIAGNOSIS — R112 Nausea with vomiting, unspecified: Secondary | ICD-10-CM

## 2011-04-06 DIAGNOSIS — F17200 Nicotine dependence, unspecified, uncomplicated: Secondary | ICD-10-CM | POA: Insufficient documentation

## 2011-04-06 DIAGNOSIS — K4090 Unilateral inguinal hernia, without obstruction or gangrene, not specified as recurrent: Secondary | ICD-10-CM

## 2011-04-06 DIAGNOSIS — K589 Irritable bowel syndrome without diarrhea: Secondary | ICD-10-CM | POA: Insufficient documentation

## 2011-04-06 DIAGNOSIS — K2970 Gastritis, unspecified, without bleeding: Secondary | ICD-10-CM | POA: Insufficient documentation

## 2011-04-06 DIAGNOSIS — R1013 Epigastric pain: Secondary | ICD-10-CM | POA: Insufficient documentation

## 2011-04-06 DIAGNOSIS — R141 Gas pain: Secondary | ICD-10-CM

## 2011-04-06 DIAGNOSIS — R109 Unspecified abdominal pain: Secondary | ICD-10-CM

## 2011-04-06 HISTORY — DX: Irritable bowel syndrome, unspecified: K58.9

## 2011-04-06 HISTORY — DX: Unspecified viral hepatitis C without hepatic coma: B19.20

## 2011-04-06 HISTORY — DX: Unspecified thoracic, thoracolumbar and lumbosacral intervertebral disc disorder: M51.9

## 2011-04-06 LAB — URINALYSIS, ROUTINE W REFLEX MICROSCOPIC
Bilirubin Urine: NEGATIVE
Ketones, ur: NEGATIVE mg/dL
Leukocytes, UA: NEGATIVE
Nitrite: NEGATIVE
Protein, ur: NEGATIVE mg/dL
Urobilinogen, UA: 0.2 mg/dL (ref 0.0–1.0)
pH: 6 (ref 5.0–8.0)

## 2011-04-06 LAB — COMPREHENSIVE METABOLIC PANEL
AST: 17 U/L (ref 0–37)
Albumin: 3.9 g/dL (ref 3.5–5.2)
Alkaline Phosphatase: 46 U/L (ref 39–117)
BUN: 7 mg/dL (ref 6–23)
Chloride: 97 mEq/L (ref 96–112)
Potassium: 4.3 mEq/L (ref 3.5–5.1)
Sodium: 132 mEq/L — ABNORMAL LOW (ref 135–145)
Total Protein: 7.7 g/dL (ref 6.0–8.3)

## 2011-04-06 LAB — CBC
MCH: 25.9 pg — ABNORMAL LOW (ref 26.0–34.0)
MCHC: 34.9 g/dL (ref 30.0–36.0)
Platelets: 246 10*3/uL (ref 150–400)
RDW: 14.3 % (ref 11.5–15.5)

## 2011-04-06 LAB — DIFFERENTIAL
Basophils Absolute: 0 10*3/uL (ref 0.0–0.1)
Basophils Relative: 0 % (ref 0–1)
Eosinophils Absolute: 0.1 10*3/uL (ref 0.0–0.7)
Lymphocytes Relative: 62 % — ABNORMAL HIGH (ref 12–46)
Monocytes Absolute: 0.4 10*3/uL (ref 0.1–1.0)
Neutrophils Relative %: 30 % — ABNORMAL LOW (ref 43–77)

## 2011-04-06 LAB — LIPASE, BLOOD: Lipase: 21 U/L (ref 11–59)

## 2011-04-06 MED ORDER — HYDROMORPHONE HCL PF 1 MG/ML IJ SOLN
1.0000 mg | Freq: Once | INTRAMUSCULAR | Status: AC
  Administered 2011-04-06: 1 mg via INTRAVENOUS
  Filled 2011-04-06: qty 1

## 2011-04-06 MED ORDER — ONDANSETRON HCL 4 MG/2ML IJ SOLN
4.0000 mg | Freq: Once | INTRAMUSCULAR | Status: AC
  Administered 2011-04-06: 4 mg via INTRAVENOUS
  Filled 2011-04-06: qty 2

## 2011-04-06 MED ORDER — PANTOPRAZOLE SODIUM 40 MG IV SOLR
40.0000 mg | Freq: Once | INTRAVENOUS | Status: AC
  Administered 2011-04-06: 40 mg via INTRAVENOUS
  Filled 2011-04-06: qty 40

## 2011-04-06 MED ORDER — IOHEXOL 300 MG/ML  SOLN
100.0000 mL | Freq: Once | INTRAMUSCULAR | Status: AC | PRN
  Administered 2011-04-06: 100 mL via INTRAVENOUS

## 2011-04-06 MED ORDER — SODIUM CHLORIDE 0.9 % IV BOLUS (SEPSIS)
1000.0000 mL | Freq: Once | INTRAVENOUS | Status: AC
  Administered 2011-04-06: 1000 mL via INTRAVENOUS

## 2011-04-06 MED ORDER — GI COCKTAIL ~~LOC~~
30.0000 mL | Freq: Once | ORAL | Status: AC
  Administered 2011-04-06: 30 mL via ORAL
  Filled 2011-04-06: qty 30

## 2011-04-06 NOTE — ED Notes (Signed)
Pt had Hepatitis C treatment at [facility], finished 02/25/11

## 2011-04-06 NOTE — ED Provider Notes (Signed)
History     CSN: [id number] Arrival date & time: 04/06/2011  9:19 PM   First MD Initiated Contact with Patient 04/06/11 2128      Chief Complaint  Patient presents with  . Flank Pain  . Abdominal Pain    (Consider location/radiation/quality/duration/timing/severity/associated sxs/prior treatment) HPI Comments: Patient presents with the referral weeks of intermittent left upper quadrant as well as epigastric pain associated with nausea.  She is accompanied to gastroenterological history including gastritis is been treated twice for H. pylori hepatitis C. He underwent a hepatitis C treatment at [facility] in early October and states his symptoms have been intermittent and worsening since then. He was told hepatitis C levels are undetectable. He is on dexilant and for acid reflux. He came in tonight because the pain is worsening.  He denies any fevers, weight loss, night sweats, chest pain or shortness of breath. States he is moving his bowels normally. He does endorse some burning with urination moreso at the conclusion of urination. Denies any testicular pain or penile discharge. He seen Dr. Christella Hartigan in the past and GI. He denies any alcohol or NSAID use.  The history is provided by the patient.    Past Medical History  Diagnosis Date  . Hepatitis C   . Ruptured disk   . IBS (irritable bowel syndrome)     Past Surgical History  Procedure Date  . Back surgery   . Hernia repair     No family history on file.  History  Substance Use Topics  . Smoking status: Current Everyday Smoker  . Smokeless tobacco: Not on file  . Alcohol Use: No      Review of Systems  Constitutional: Positive for activity change and fatigue. Negative for fever.  Respiratory: Negative for cough, choking and shortness of breath.   Cardiovascular: Negative for chest pain.  Gastrointestinal: Positive for nausea, vomiting and abdominal pain.  Genitourinary: Positive for flank pain. Negative for dysuria,  hematuria and testicular pain.  Musculoskeletal: Negative for back pain.  Skin: Negative for rash.  Neurological: Negative for dizziness, weakness and headaches.    Allergies  Aspirin and Cabbage  Home Medications   Current Outpatient Rx  Name Route Sig Dispense Refill  . ALPRAZOLAM 0.5 MG PO TABS Oral Take 0.5 mg by mouth 2 (two) times daily.      . AMOXICILLIN 500 MG PO CAPS Oral Take 1,000 mg by mouth 2 (two) times daily.      Marland Kitchen BISMUTH SUBSALICYLATE 262 MG/15ML PO SUSP Oral Take 15 mLs by mouth every 6 (six) hours as needed. For IBS     . CITALOPRAM HYDROBROMIDE 20 MG PO TABS Oral Take 20 mg by mouth daily.      . DEXLANSOPRAZOLE 60 MG PO CPDR Oral Take 60 mg by mouth daily.      . IBUPROFEN 200 MG PO TABS Oral Take 600 mg by mouth every 6 (six) hours as needed. For pain     . METHADONE HCL 10 MG PO TABS Oral Take 10 mg by mouth 3 (three) times daily.      Marland Kitchen ONE-DAILY MULTI VITAMINS PO TABS Oral Take 1 tablet by mouth daily.      Marland Kitchen OVER THE COUNTER MEDICATION Oral Take 2 tablets by mouth once. ROWATINEX ([place] Medication) for renal disorders, infections of the urinary tract and urolithiasis     . OXYCODONE HCL 30 MG PO TABS Oral Take 30 mg by mouth 4 (four) times daily.      Marland Kitchen  AZO TABS PO Oral Take 1 tablet by mouth 3 (three) times daily.      Marland Kitchen POLYETHYLENE GLYCOL 3350 PO POWD Oral Take 17 g by mouth daily.        BP 133/84  Pulse 74  Temp(Src) 98.1 F (36.7 C) (Oral)  Resp 18  SpO2 100%  Physical Exam  Constitutional: He is oriented to person, place, and time. He appears well-developed. No distress.  HENT:  Head: Normocephalic and atraumatic.  Mouth/Throat: Oropharynx is clear and moist. No oropharyngeal exudate.  Eyes: Conjunctivae are normal. Pupils are equal, round, and reactive to light.  Neck: Normal range of motion.  Cardiovascular: Normal rate, regular rhythm and normal heart sounds.   Pulmonary/Chest: Effort normal and breath sounds normal. No respiratory  distress.  Abdominal: Soft. There is tenderness.       Left upper quadrant epigastric tenderness  Genitourinary: No penile tenderness.       Normal external genitalia, no testicular pain  Musculoskeletal: Normal range of motion. He exhibits no edema and no tenderness.  Neurological: He is alert and oriented to person, place, and time. No cranial nerve deficit.  Skin: Skin is warm.    ED Course  Procedures (including critical care time)  Labs Reviewed  CBC - Abnormal; Notable for the following:    MCV 74.4 (*)    MCH 25.9 (*)    All other components within normal limits  DIFFERENTIAL - Abnormal; Notable for the following:    Neutrophils Relative 30 (*)    Lymphocytes Relative 62 (*)    Neutro Abs 1.6 (*)    All other components within normal limits  COMPREHENSIVE METABOLIC PANEL - Abnormal; Notable for the following:    Sodium 132 (*)    Total Bilirubin 0.1 (*)    All other components within normal limits  URINALYSIS, ROUTINE W REFLEX MICROSCOPIC  LIPASE, BLOOD   Ct Abdomen Pelvis W Contrast  04/06/2011  *RADIOLOGY REPORT*  Clinical Data: Mid abdominal pain, distension, nausea and vomiting.  CT ABDOMEN AND PELVIS WITH CONTRAST  Technique:  Multidetector CT imaging of the abdomen and pelvis was performed following the standard protocol during bolus administration of intravenous contrast.  Contrast: OMNIPAQUE IOHEXOL 300 MG/ML IV SOLN  Comparison: CT of the abdomen and pelvis performed 02/07/2010  Findings: The visualized lung bases are clear.  The liver and spleen are unremarkable in appearance.  The gallbladder is within normal limits.  The pancreas and adrenal glands are unremarkable.  The kidneys are unremarkable in appearance.  There is no evidence of hydronephrosis.  No renal or ureteral stones are seen.  No perinephric stranding is appreciated.  No free fluid is identified.  The small bowel is unremarkable in appearance.  The stomach is within normal limits.  No acute  vascular abnormalities are seen.  A retroaortic left renal vein is incidentally noted.  The appendix is normal in caliber and contains air, without evidence for appendicitis.  The colon is partially filled with stool and is unremarkable in appearance.  The bladder is mildly distended and appears grossly unremarkable. The prostate remains normal in size.  A small left inguinal hernia is noted, containing only fat.  No inguinal lymphadenopathy is seen.  No acute osseous abnormalities are identified.  IMPRESSION:  1.  No acute abnormalities identified within the abdomen or pelvis. 2.  Small left inguinal hernia, containing only fat.  Original Report Authenticated By: Tonia Ghent, M.D.     1. Abdominal pain   2.  Gastritis       MDM  Upper abdominal pain with history of gastritis as well as hepatitis C.  Vital signs are stable patient is nontoxic-appearing on exam.  LFTs and lipase are normal. Urinalysis is normal. CT scan does not show any acute abnormalities within the abdomen or pelvis.  Patient said that it improved with Zofran GI cocktail is likely a component of gastritis or acid reflux. He is to follow up with the gastroenterology for probable endoscopy        Glynn Octave, MD 04/07/11 [phone]

## 2011-04-06 NOTE — ED Notes (Signed)
Pt c/o right flank pain and left upper quadrant abd pain. Pt has had nausea and some vomiting.

## 2011-04-22 DIAGNOSIS — K219 Gastro-esophageal reflux disease without esophagitis: Secondary | ICD-10-CM | POA: Insufficient documentation

## 2011-07-21 IMAGING — CT CT ABD-PELV W/O CM
2 of 4 series · 17 of 46 positions shown, 19 images · non-contrast
Comparison: None.

CLINICAL DATA: Right side abdominal pain.

CT ABDOMEN AND PELVIS WITHOUT CONTRAST
TECHNIQUE: Multidetector CT imaging of the abdomen and pelvis was
performed following the standard protocol without intravenous
contrast.

[Series 3: stone study · axial · 0.67mm/px · z∈[-722,-317]mm · 14 of 89 slices shown, 16 images]
[im 4/89  soft-tissue]
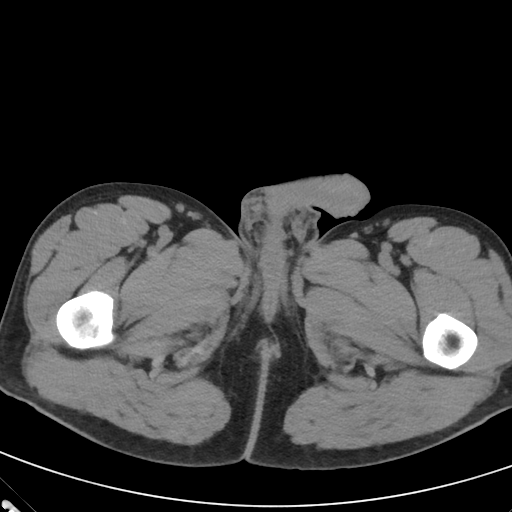
[im 4/89  bone]
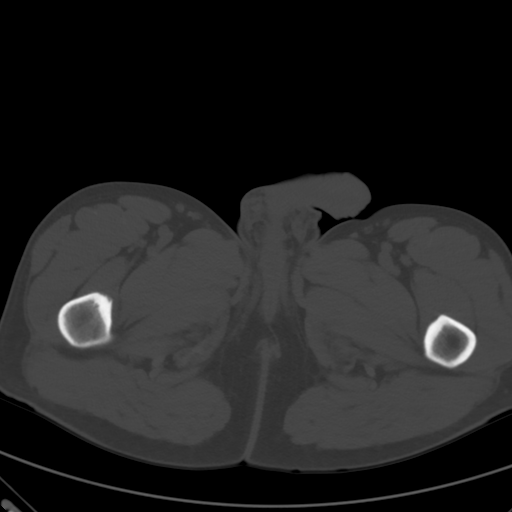
[im 12/89  soft-tissue]
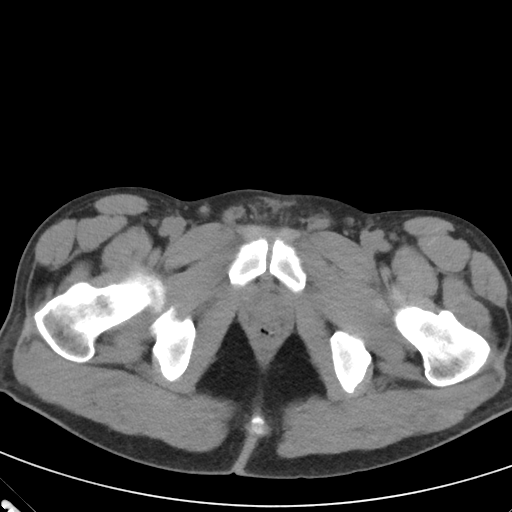
[im 19/89  soft-tissue]
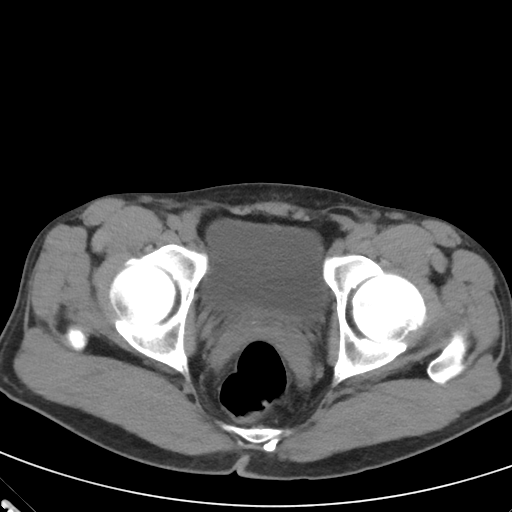
[im 23/89  soft-tissue]
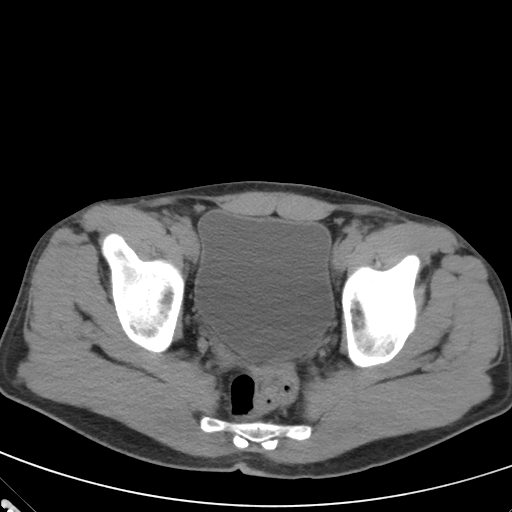
[im 30/89  soft-tissue]
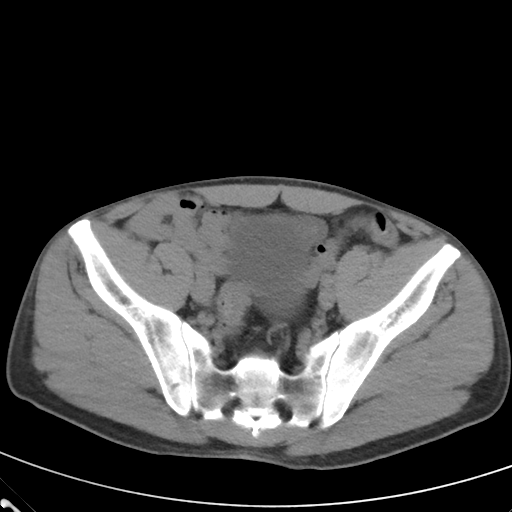
[im 37/89  soft-tissue]
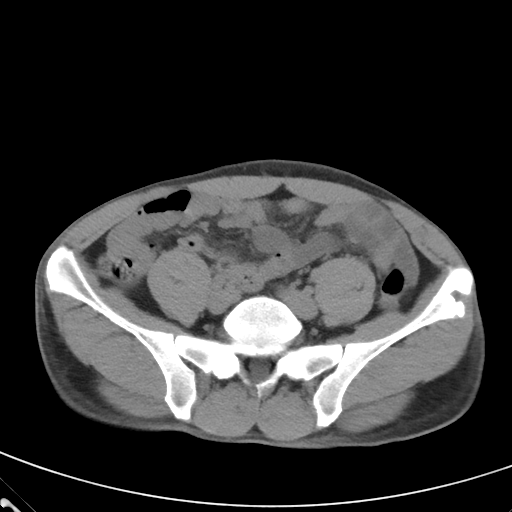
[im 41/89  soft-tissue]
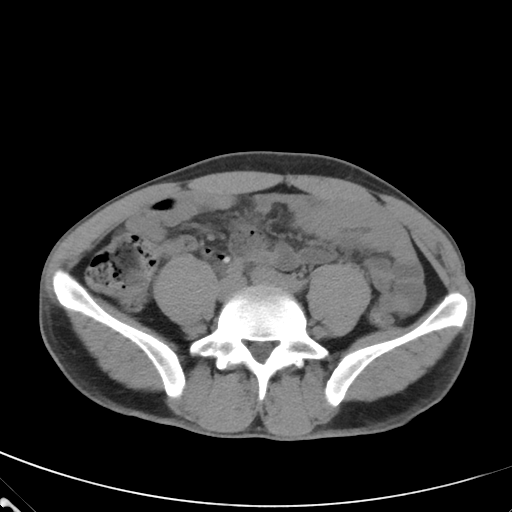
[im 48/89  soft-tissue]
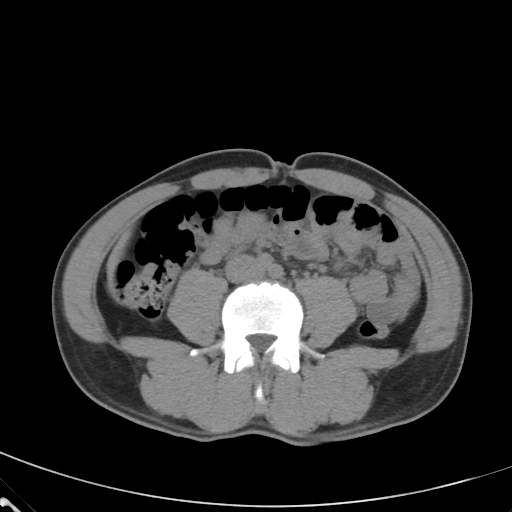
[im 52/89  soft-tissue]
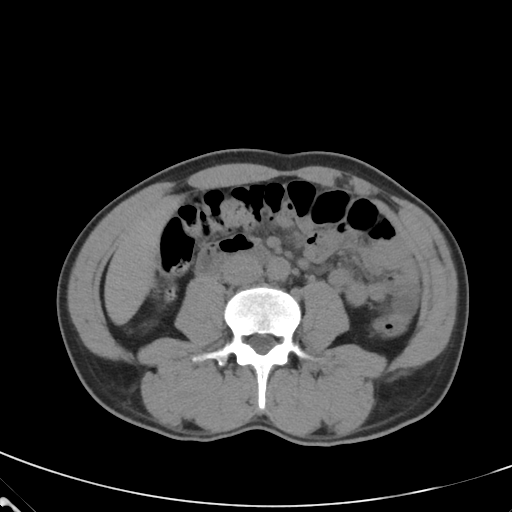
[im 52/89  bone]
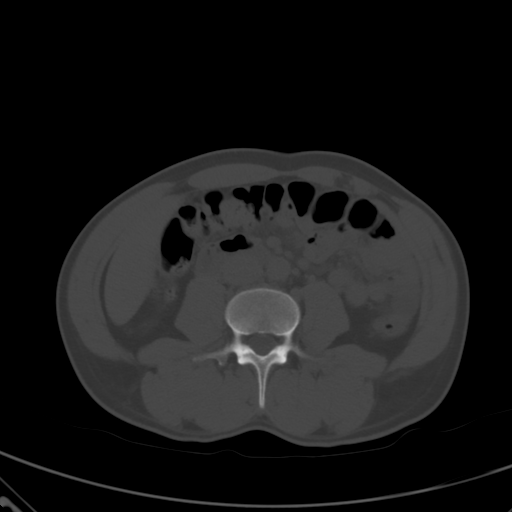
[im 59/89  soft-tissue]
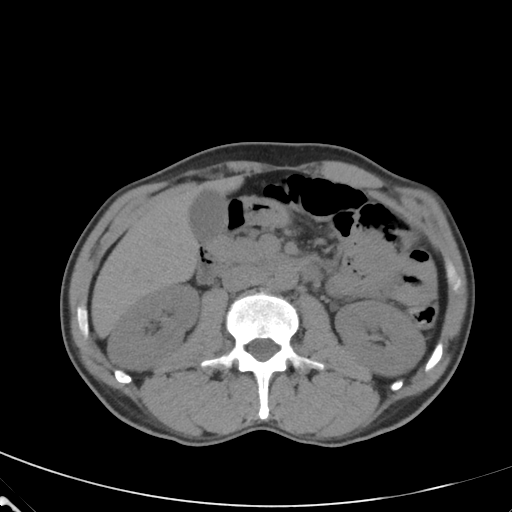
[im 67/89  soft-tissue]
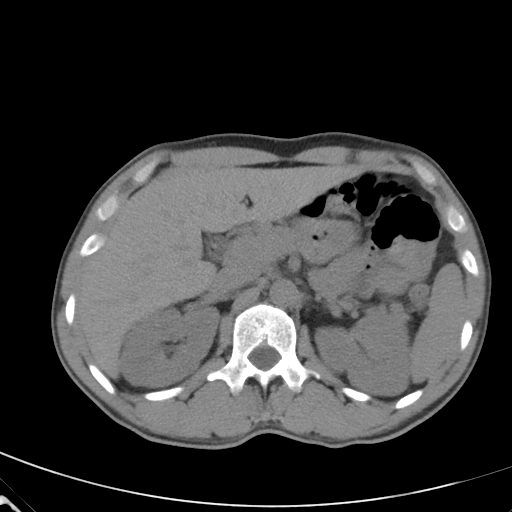
[im 70/89  soft-tissue]
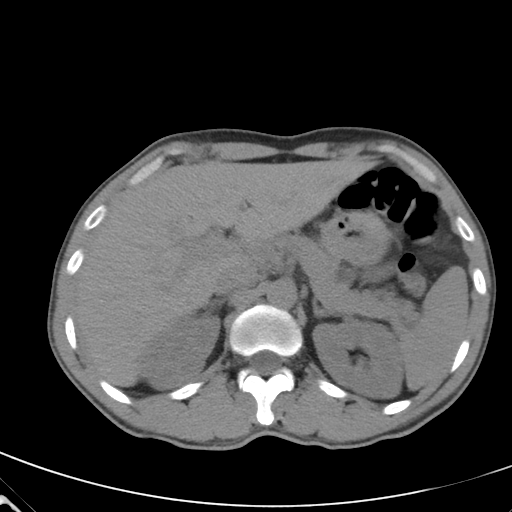
[im 78/89  soft-tissue]
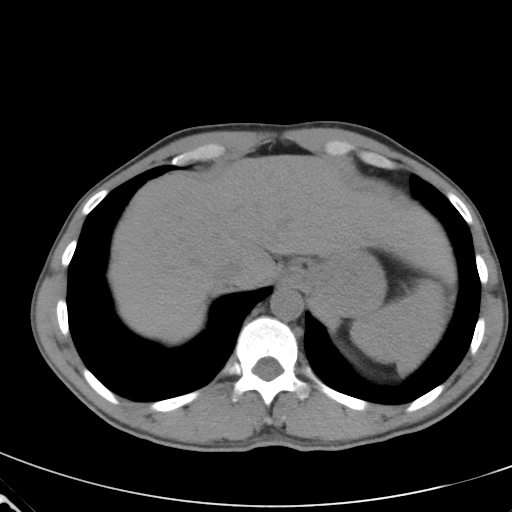
[im 85/89  soft-tissue]
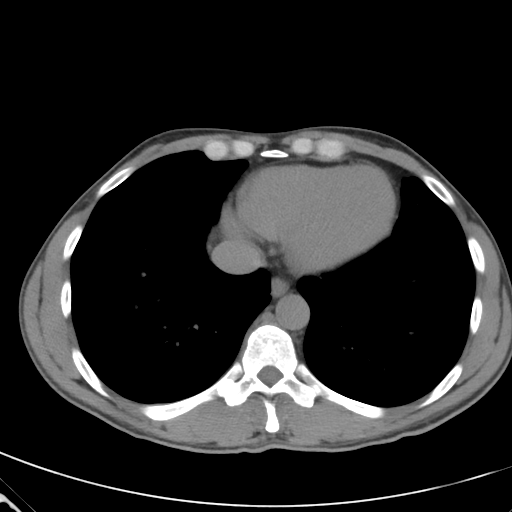

[cor · coronal · 0.86mm/px · 3 of 59 slices shown]
[im 20/59  soft-tissue]
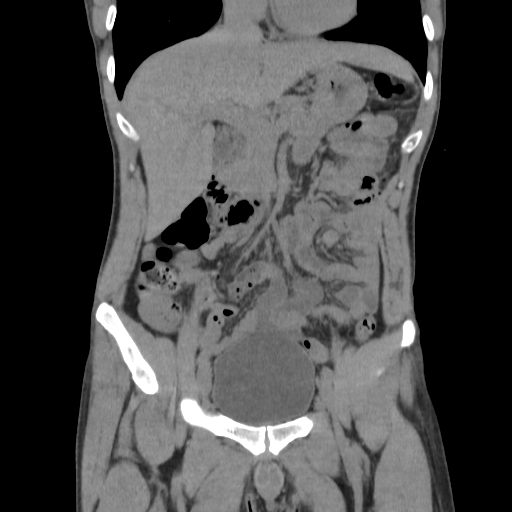
[im 26/59  soft-tissue]
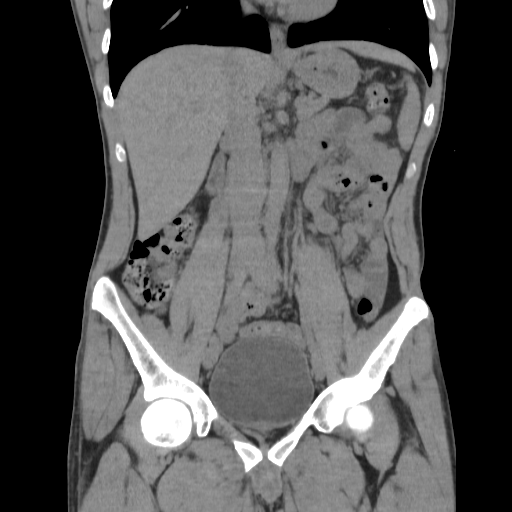
[im 33/59  soft-tissue]
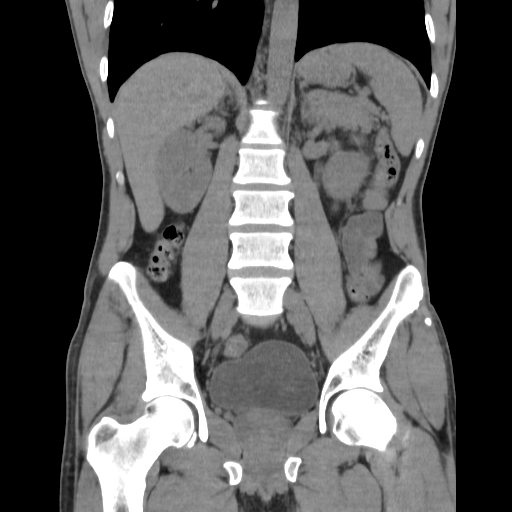

[17 of 46 positions shown; findings below may reference images not displayed]

FINDINGS: The lung bases are clear.  No pleural or pericardial
effusion.

The kidneys have a normal uninfused appearance bilaterally.  No
renal or ureteral stones or hydronephrosis.  The liver,
gallbladder, spleen, adrenal glands and pancreas all appear normal.
There is high attenuation material within the appendix but no
periappendiceal inflammatory change or dilatation is identified.
There is no pelvic fluid.  No lymphadenopathy is seen.  The stomach
and small and large bowel appear normal.  No focal bony
abnormality.
IMPRESSION: Negative urinary tract stone.  No acute finding or finding to
explain the patient's symptoms.

## 2011-07-21 IMAGING — CR DG CHEST 2V
2 series · 2 of 2 positions shown · non-contrast
Comparison: 11/22/2009

CLINICAL DATA: Right-sided chest pain

CHEST - 2 VIEW

[w chest pa]
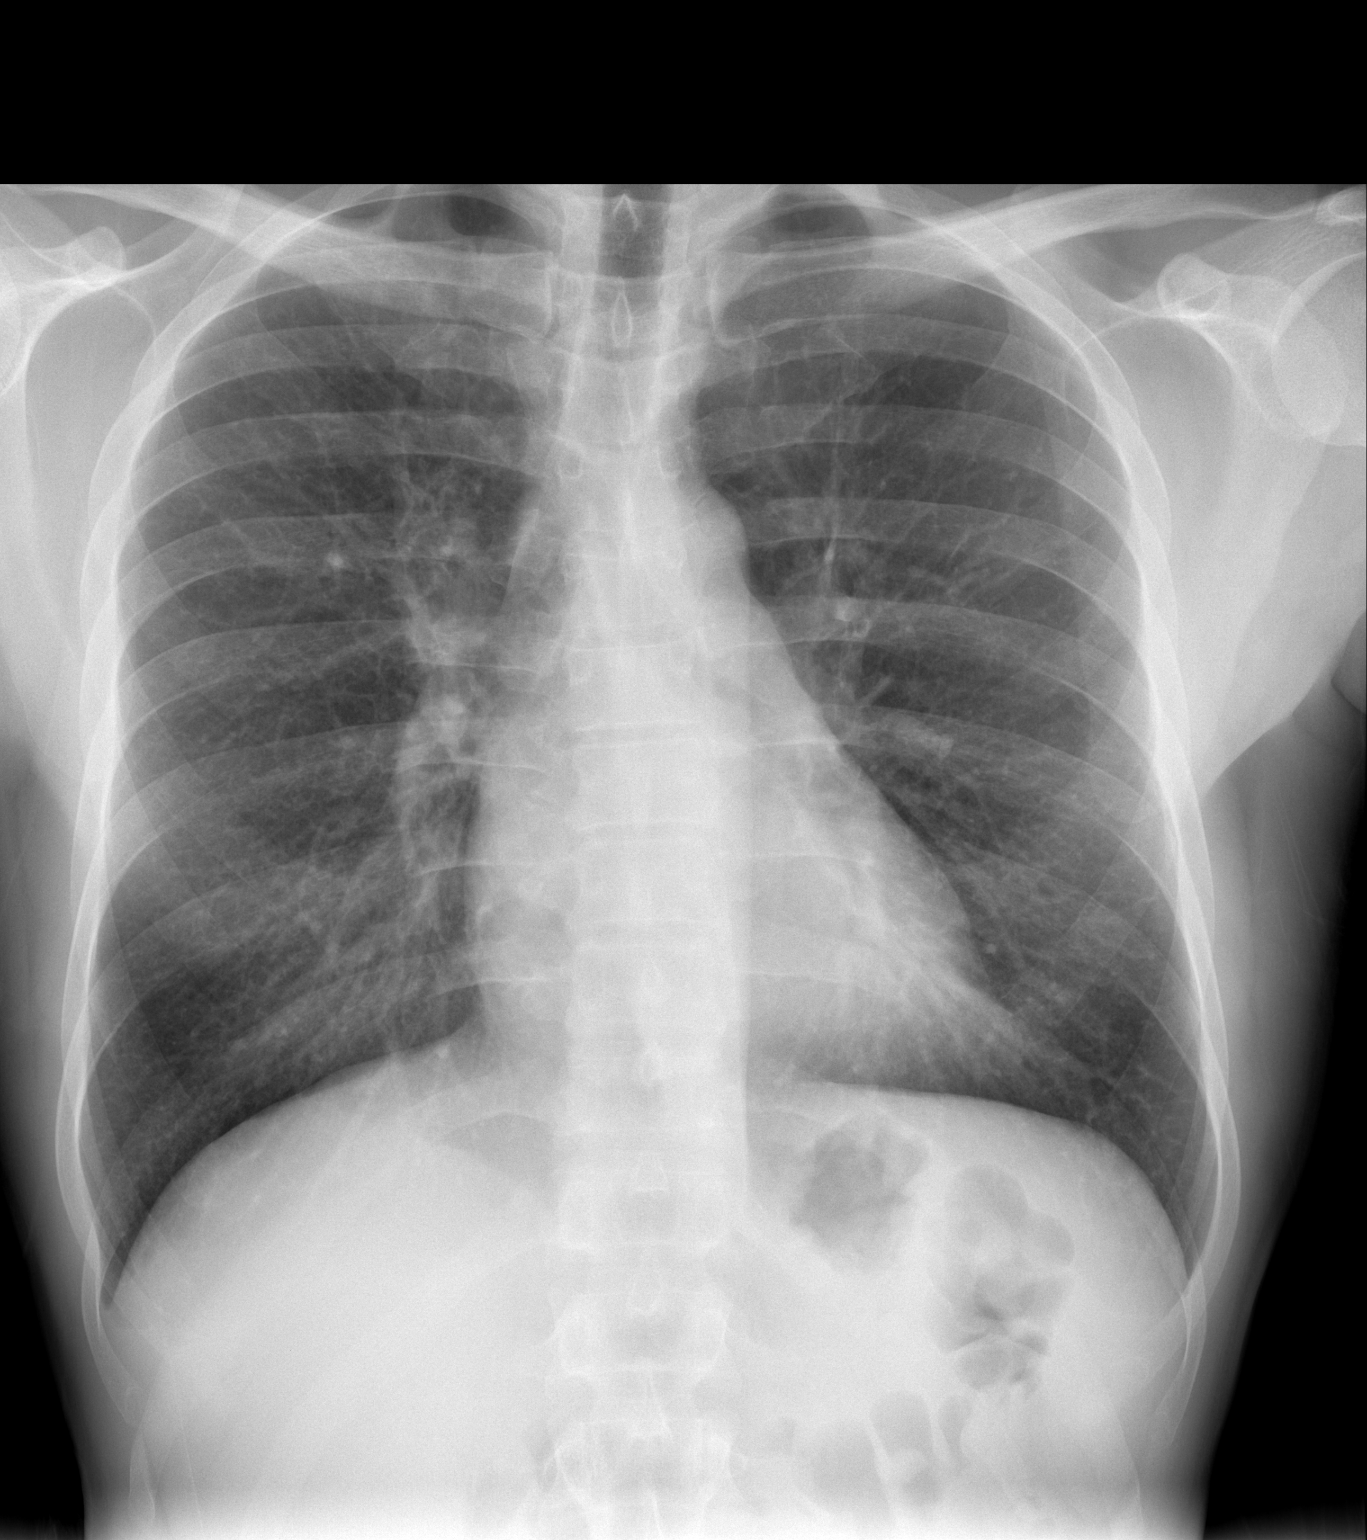

[w chest lat]
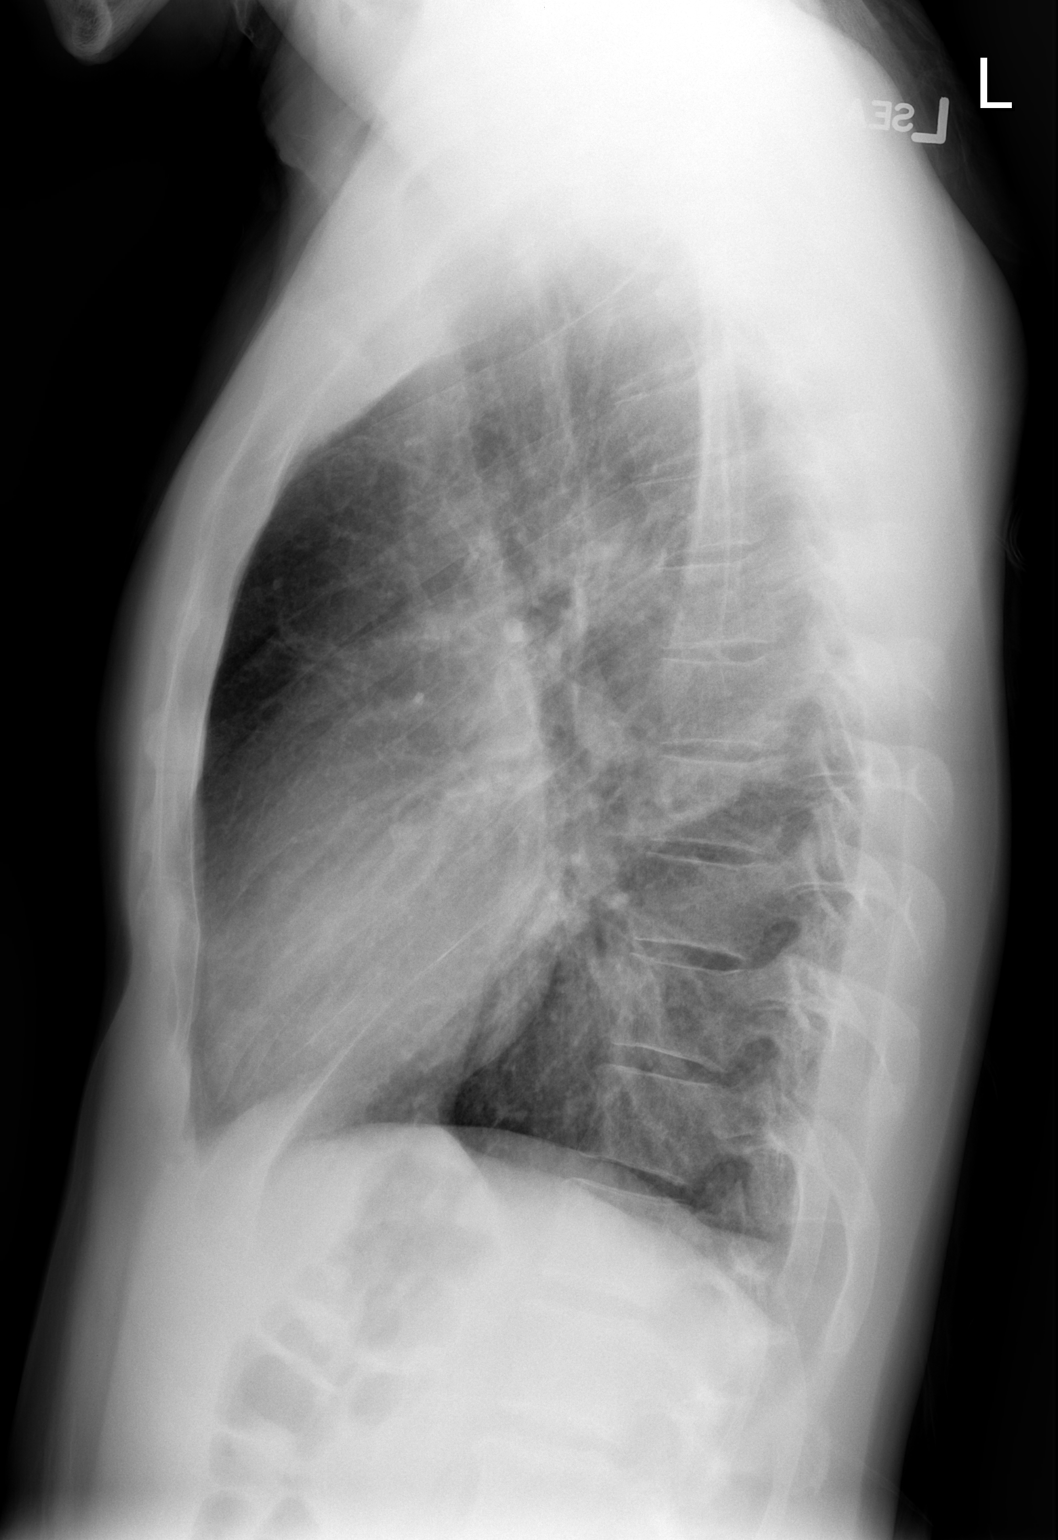

[2 of 2 positions shown; findings below may reference images not displayed]

FINDINGS: Heart size is at the upper limits normal.  The
mediastinum is unremarkable.  The lungs show chronically abnormal
interstitial markings consistent with a smoking history.  No sign
of active infiltrate, mass, effusion or collapse.  No abnormal bony
finding.
IMPRESSION: Chronically abnormal interstitial lung markings consistent with
smoking history.  No active process evident.

## 2011-08-04 ENCOUNTER — Encounter (HOSPITAL_COMMUNITY): Payer: Self-pay | Admitting: Emergency Medicine

## 2011-08-04 ENCOUNTER — Emergency Department (HOSPITAL_COMMUNITY)
Admission: EM | Admit: 2011-08-04 | Discharge: 2011-08-05 | Disposition: A | Discharge status: Rehabilitation Facility | Payer: Self-pay | Attending: Emergency Medicine | Admitting: Emergency Medicine

## 2011-08-04 DIAGNOSIS — B1920 Unspecified viral hepatitis C without hepatic coma: Secondary | ICD-10-CM | POA: Insufficient documentation

## 2011-08-04 DIAGNOSIS — K589 Irritable bowel syndrome without diarrhea: Secondary | ICD-10-CM | POA: Insufficient documentation

## 2011-08-04 DIAGNOSIS — F17200 Nicotine dependence, unspecified, uncomplicated: Secondary | ICD-10-CM | POA: Insufficient documentation

## 2011-08-04 DIAGNOSIS — F1920 Other psychoactive substance dependence, uncomplicated: Secondary | ICD-10-CM | POA: Insufficient documentation

## 2011-08-04 DIAGNOSIS — F1010 Alcohol abuse, uncomplicated: Secondary | ICD-10-CM | POA: Insufficient documentation

## 2011-08-04 LAB — CBC
HCT: 44.7 % (ref 39.0–52.0)
Hemoglobin: 15.5 g/dL (ref 13.0–17.0)
MCHC: 34.7 g/dL (ref 30.0–36.0)
MCV: 77.2 fL — ABNORMAL LOW (ref 78.0–100.0)

## 2011-08-04 LAB — URINALYSIS, ROUTINE W REFLEX MICROSCOPIC
Bilirubin Urine: NEGATIVE
Glucose, UA: NEGATIVE mg/dL
Ketones, ur: NEGATIVE mg/dL
Leukocytes, UA: NEGATIVE
Nitrite: NEGATIVE
Specific Gravity, Urine: 1.009 (ref 1.005–1.030)
pH: 6.5 (ref 5.0–8.0)

## 2011-08-04 LAB — RAPID URINE DRUG SCREEN, HOSP PERFORMED
Amphetamines: NOT DETECTED
Benzodiazepines: NOT DETECTED
Opiates: NOT DETECTED

## 2011-08-04 LAB — BASIC METABOLIC PANEL
BUN: 3 mg/dL — ABNORMAL LOW (ref 6–23)
Chloride: 99 mEq/L (ref 96–112)
GFR calc Af Amer: >90 mL/min (ref >90)
GFR calc non Af Amer: >90 mL/min (ref >90)
Potassium: 4.7 mEq/L (ref 3.5–5.1)
Sodium: 136 mEq/L (ref 135–145)

## 2011-08-04 LAB — ETHANOL: Alcohol, Ethyl (B): <11 mg/dL (ref 0–11)

## 2011-08-04 MED ORDER — TRAMADOL HCL 50 MG PO TABS
50.0000 mg | ORAL_TABLET | Freq: Four times a day (QID) | ORAL | Status: DC | PRN
  Administered 2011-08-05 (×3): 50 mg via ORAL
  Filled 2011-08-04 (×3): qty 1

## 2011-08-04 MED ORDER — ZOLPIDEM TARTRATE 5 MG PO TABS
5.0000 mg | ORAL_TABLET | Freq: Every evening | ORAL | Status: DC | PRN
  Administered 2011-08-05: 5 mg via ORAL
  Filled 2011-08-04: qty 1

## 2011-08-04 MED ORDER — ONE-DAILY MULTI VITAMINS PO TABS: 1.0000 | ORAL_TABLET | Freq: Every day | ORAL | Status: DC

## 2011-08-04 MED ORDER — SUCRALFATE 1 G PO TABS
1.0000 g | ORAL_TABLET | Freq: Two times a day (BID) | ORAL | Status: DC
  Administered 2011-08-05 (×2): 1 g via ORAL
  Filled 2011-08-04 (×6): qty 1

## 2011-08-04 MED ORDER — ACETAMINOPHEN 325 MG PO TABS
650.0000 mg | ORAL_TABLET | ORAL | Status: DC | PRN
  Administered 2011-08-05: 650 mg via ORAL
  Filled 2011-08-04: qty 2

## 2011-08-04 MED ORDER — ONDANSETRON HCL 4 MG PO TABS: 4.0000 mg | ORAL_TABLET | Freq: Three times a day (TID) | ORAL | Status: DC | PRN

## 2011-08-04 MED ORDER — ALPRAZOLAM 0.5 MG PO TABS
0.5000 mg | ORAL_TABLET | Freq: Two times a day (BID) | ORAL | Status: DC
  Administered 2011-08-05 (×2): 0.5 mg via ORAL
  Filled 2011-08-04 (×2): qty 1

## 2011-08-04 MED ORDER — PANTOPRAZOLE SODIUM 40 MG PO TBEC
40.0000 mg | DELAYED_RELEASE_TABLET | Freq: Every day | ORAL | Status: DC
  Administered 2011-08-05 (×2): 40 mg via ORAL
  Filled 2011-08-04 (×2): qty 1

## 2011-08-04 MED ORDER — NICOTINE 21 MG/24HR TD PT24: 21.0000 mg | MEDICATED_PATCH | Freq: Every day | TRANSDERMAL | Status: DC

## 2011-08-04 MED ORDER — CITALOPRAM HYDROBROMIDE 20 MG PO TABS
20.0000 mg | ORAL_TABLET | Freq: Every day | ORAL | Status: DC
Start: 2011-08-05 — End: 2011-08-05
  Administered 2011-08-05: 20 mg via ORAL
  Filled 2011-08-04 (×2): qty 1

## 2011-08-04 MED ORDER — ADULT MULTIVITAMIN W/MINERALS CH
1.0000 | ORAL_TABLET | Freq: Every day | ORAL | Status: DC
  Administered 2011-08-05: 1 via ORAL
  Filled 2011-08-04: qty 1

## 2011-08-04 NOTE — ED Notes (Signed)
Pt states he has been on medication for his back for the past 6 to 7 years and wants to get off of it  Pt here for detox off pain meds

## 2011-08-04 NOTE — ED Provider Notes (Signed)
History     CSN: [id number]  Arrival date & time 08/04/11  4098   First MD Initiated Contact with Patient 08/04/11 2152      Chief Complaint  Patient presents with  . Medical Clearance    (Consider location/radiation/quality/duration/timing/severity/associated sxs/prior treatment) HPI Comments: Patient here for detox off pain medication - states that he has been on the medication for the past 6-7 years after neck and back injury - he states that he has had to progressively use more and more medication and that he "is just not living".  States that he is unable to work, that all he does is sit in his home and "not do anything".  He denies nausea, vomiting, fever, chills, previous detox in the past.  Also denies homicidal or suicidal ideation.  Patient is a 46 y.o. male presenting with drug/alcohol assessment. The history is provided by the patient. No language interpreter was used.  Drug / Alcohol Assessment Primary symptoms include no confusion, no somnolence, no loss of consciousness, no seizures, no weakness, no agitation, no delusions, no hallucinations, no self-injury, no violence, and no intoxication. This is a chronic problem. The current episode started more than 1 week ago. The problem has not changed since onset.Suspected agents include opiates. Pertinent negatives include no fever, no injury, no nausea, no bladder incontinence and no bowel incontinence.    Past Medical History  Diagnosis Date  . Hepatitis C   . Ruptured disk   . IBS (irritable bowel syndrome)     Past Surgical History  Procedure Date  . Back surgery   . Hernia repair     History reviewed. No pertinent family history.  History  Substance Use Topics  . Smoking status: Current Everyday Smoker    Types: Cigarettes  . Smokeless tobacco: Not on file  . Alcohol Use: No      Review of Systems  Constitutional: Negative for fever.  Gastrointestinal: Negative for nausea and bowel incontinence.    Genitourinary: Negative for bladder incontinence.  Neurological: Negative for seizures, loss of consciousness and weakness.  Psychiatric/Behavioral: Negative for hallucinations, confusion, self-injury and agitation.  All other systems reviewed and are negative.    Allergies  Aspirin and Cabbage  Home Medications   Current Outpatient Rx  Name Route Sig Dispense Refill  . ALPRAZOLAM 0.5 MG PO TABS Oral Take 0.5 mg by mouth 2 (two) times daily.      Marland Kitchen CITALOPRAM HYDROBROMIDE 20 MG PO TABS Oral Take 20 mg by mouth daily.      Marland Kitchen ONE-DAILY MULTI VITAMINS PO TABS Oral Take 1 tablet by mouth daily.      Marland Kitchen OMEPRAZOLE 40 MG PO CPDR Oral Take 40 mg by mouth daily.    . OXYCODONE HCL 30 MG PO TABS Oral Take 30 mg by mouth 4 (four) times daily.      . SUCRALFATE 1 G PO TABS Oral Take 1 g by mouth 2 (two) times daily.    . TRAMADOL HCL 50 MG PO TABS Oral Take 50 mg by mouth every 6 (six) hours as needed. For pain      BP 107/72  Pulse 85  Temp(Src) 97.7 F (36.5 C) (Oral)  Resp 18  SpO2 99%  Physical Exam  Nursing note and vitals reviewed. Constitutional: He is oriented to person, place, and time. He appears well-developed and well-nourished. No distress.  HENT:  Head: Normocephalic and atraumatic.  Right Ear: External ear normal.  Left Ear: External ear normal.  Nose:  Nose normal.  Mouth/Throat: Oropharynx is clear and moist. No oropharyngeal exudate.  Eyes: Conjunctivae are normal. Pupils are equal, round, and reactive to light. No scleral icterus.  Neck: Normal range of motion. Neck supple.  Cardiovascular: Normal rate, regular rhythm and normal heart sounds.  Exam reveals no gallop and no friction rub.   No murmur heard. Pulmonary/Chest: Effort normal and breath sounds normal. No respiratory distress. He has no wheezes. He has no rales. He exhibits no tenderness.  Abdominal: Soft. Bowel sounds are normal. He exhibits no distension. There is no tenderness.  Musculoskeletal: Normal  range of motion. He exhibits no edema and no tenderness.  Lymphadenopathy:    He has no cervical adenopathy.  Neurological: He is alert and oriented to person, place, and time. No cranial nerve deficit.  Skin: Skin is warm and dry. No rash noted. No erythema. No pallor.  Psychiatric: He has a normal mood and affect. His behavior is normal. Judgment and thought content normal.    ED Course  Procedures (including critical care time)  Labs Reviewed  CBC - Abnormal; Notable for the following:    MCV 77.2 (*)    All other components within normal limits  URINALYSIS, ROUTINE W REFLEX MICROSCOPIC  BASIC METABOLIC PANEL  URINE RAPID DRUG SCREEN (HOSP PERFORMED)  ETHANOL   No results found.  Results for orders placed during the hospital encounter of 08/04/11  CBC      Component Value Range   WBC 5.4  4.0 - 10.5 (K/uL)   RBC 5.79  4.22 - 5.81 (MIL/uL)   Hemoglobin 15.5  13.0 - 17.0 (g/dL)   HCT 16.1  09.6 - 04.5 (%)   MCV 77.2 (*) 78.0 - 100.0 (fL)   MCH 26.8  26.0 - 34.0 (pg)   MCHC 34.7  30.0 - 36.0 (g/dL)   RDW 40.9  81.1 - 91.4 (%)   Platelets 265  150 - 400 (K/uL)  BASIC METABOLIC PANEL      Component Value Range   Sodium 136  135 - 145 (mEq/L)   Potassium 4.7  3.5 - 5.1 (mEq/L)   Chloride 99  96 - 112 (mEq/L)   CO2 30  19 - 32 (mEq/L)   Glucose, Bld 87  70 - 99 (mg/dL)   BUN 3 (*) 6 - 23 (mg/dL)   Creatinine, Ser 7.82  0.50 - 1.35 (mg/dL)   Calcium 9.2  8.4 - 95.6 (mg/dL)   GFR calc non Af Amer >90  >90 (mL/min)   GFR calc Af Amer >90  >90 (mL/min)  URINE RAPID DRUG SCREEN (HOSP PERFORMED)      Component Value Range   Opiates NONE DETECTED  NONE DETECTED    Cocaine NONE DETECTED  NONE DETECTED    Benzodiazepines NONE DETECTED  NONE DETECTED    Amphetamines NONE DETECTED  NONE DETECTED    Tetrahydrocannabinol NONE DETECTED  NONE DETECTED    Barbiturates NONE DETECTED  NONE DETECTED   ETHANOL      Component Value Range   Alcohol, Ethyl (B) <11  0 - 11 (mg/dL)    URINALYSIS, ROUTINE W REFLEX MICROSCOPIC      Component Value Range   Color, Urine YELLOW  YELLOW    APPearance CLEAR  CLEAR    Specific Gravity, Urine 1.009  1.005 - 1.030    pH 6.5  5.0 - 8.0    Glucose, UA NEGATIVE  NEGATIVE (mg/dL)   Hgb urine dipstick NEGATIVE  NEGATIVE    Bilirubin  Urine NEGATIVE  NEGATIVE    Ketones, ur NEGATIVE  NEGATIVE (mg/dL)   Protein, ur NEGATIVE  NEGATIVE (mg/dL)   Urobilinogen, UA 0.2  0.0 - 1.0 (mg/dL)   Nitrite NEGATIVE  NEGATIVE    Leukocytes, UA NEGATIVE  NEGATIVE   CBC      Component Value Range   WBC 4.3  4.0 - 10.5 (K/uL)   RBC 5.61  4.22 - 5.81 (MIL/uL)   Hemoglobin 14.9  13.0 - 17.0 (g/dL)   HCT 62.1  30.8 - 65.7 (%)   MCV 76.8 (*) 78.0 - 100.0 (fL)   MCH 26.6  26.0 - 34.0 (pg)   MCHC 34.6  30.0 - 36.0 (g/dL)   RDW 84.6  96.2 - 95.2 (%)   Platelets 244  150 - 400 (K/uL)  DIFFERENTIAL      Component Value Range   Neutrophils Relative 35 (*) 43 - 77 (%)   Neutro Abs 1.5 (*) 1.7 - 7.7 (K/uL)   Lymphocytes Relative 56 (*) 12 - 46 (%)   Lymphs Abs 2.4  0.7 - 4.0 (K/uL)   Monocytes Relative 8  3 - 12 (%)   Monocytes Absolute 0.3  0.1 - 1.0 (K/uL)   Eosinophils Relative 1  0 - 5 (%)   Eosinophils Absolute 0.0  0.0 - 0.7 (K/uL)   Basophils Relative 0  0 - 1 (%)   Basophils Absolute 0.0  0.0 - 0.1 (K/uL)  ELECTROLYTE PANEL      Component Value Range   Sodium 135  135 - 145 (mEq/L)   Potassium 3.7  3.5 - 5.1 (mEq/L)   Chloride 100  96 - 112 (mEq/L)   CO2 26  19 - 32 (mEq/L)   No results found.   Narcotic dependence Detox from narcotics    MDM  Patient medically cleared for ACT evaluation for narcotic dependence and detox request        Scarlette Calico C. [place], [place] 08/06/11 [phone]

## 2011-08-05 LAB — CBC
HCT: 43.1 % (ref 39.0–52.0)
Hemoglobin: 14.9 g/dL (ref 13.0–17.0)
MCH: 26.6 pg (ref 26.0–34.0)
RBC: 5.61 MIL/uL (ref 4.22–5.81)

## 2011-08-05 LAB — DIFFERENTIAL
Lymphs Abs: 2.4 10*3/uL (ref 0.7–4.0)
Monocytes Absolute: 0.3 10*3/uL (ref 0.1–1.0)
Monocytes Relative: 8 % (ref 3–12)
Neutro Abs: 1.5 10*3/uL — ABNORMAL LOW (ref 1.7–7.7)
Neutrophils Relative %: 35 % — ABNORMAL LOW (ref 43–77)

## 2011-08-05 LAB — ELECTROLYTE PANEL
CO2: 26 meq/L (ref 19–32)
Chloride: 100 meq/L (ref 96–112)
Potassium: 3.7 meq/L (ref 3.5–5.1)
Sodium: 135 meq/L (ref 135–145)

## 2011-08-05 NOTE — ED Notes (Signed)
C/o pain in legs and right side, 9/10

## 2011-08-05 NOTE — ED Notes (Signed)
Pt reports wanting help with detox off pain meds, states I know it is going to be hard but I know I have a problem, I have taken almost the whole bottle of my pills, sometimes almost 10 a day.

## 2011-08-05 NOTE — BHH Counselor (Addendum)
Per shift report, patient referred to [facility] and pending review. Writer contacted [facility] and spoke to [place]. She sts that they did not receive any faxed information related to patient. Writer re-faxed patients information so that they may review his labs, clinicals, etc. For a possible admission.  Later received a call from [facility] asking that nursing staff re-draw labs (CBC and electrolyte). Informed Velna Hatchet, which is patients nurse that labs needed to be redrawn. Velna Hatchet will complete this process and make me aware when complete.

## 2011-08-05 NOTE — BH Assessment (Signed)
Assessment Note   Brandon Ali is a 46 y.o. male who presents to [facility] voluntarily, requesting detox from Oxycodone. Pt reports he was prescribed Oxycodone 6 or 7 years ago for back pain. He reports since that time he has become addicted and has been abusing his prescription. Pt reports he is prescribed 30mg  4 times daily, but frequently takes as many as 10. Pt reports over the past 2 weeks he has been consistently taking 10 pills per day. Pt reports seeking treatment at this time because he feels like he is "out of life." Pt reports symptoms of depression including fatigue and anhedonia. Pt also reports vegetative symptoms, stating he does not want to get out of bed anymore. Pt reports he feels like Oxycodone has "taken my life." He further states he has been "praying for God to take my life or give me life." Pt reports he has recently become worried that he will die of an accidental overdose and will not be found "until they come for my rent." Pt reports no social supports.  Pt denies any current or past SI, but frequently stated he is "tired and out of life." Pt denies any HI, [facility], and other substance use. Pt would like detox at this time and assistance with learning alternative ways to manage pain.   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 Hepatitis C   . Ruptured disk   . IBS (irritable bowel syndrome)    Axis IV: other psychosocial or environmental problems, problems related to social environment and problems with primary support group Axis V: 41-50 serious symptoms  Past Medical History:  Past Medical History  Diagnosis Date  . Hepatitis C   . Ruptured disk   . IBS (irritable bowel syndrome)     Past Surgical History  Procedure Date  . Back surgery   . Hernia repair     Family History: History reviewed. No pertinent family history.  Social History:  reports that he has been smoking Cigarettes.  He  does not have any smokeless tobacco history on file. He reports that he uses illicit drugs. He reports that he does not drink alcohol.  Additional Social History:  Alcohol / Drug Use History of alcohol / drug use?: Yes Substance #1 Name of Substance 1: oxycodone 1 - Age of First Use: 38 1 - Amount (size/oz): 150-300MG  1 - Frequency: daily 1 - Duration: 6 to7 years 1 - Last Use / Amount: 08/04/11 Allergies:  Allergies  Allergen Reactions  . Aspirin     Foot swells  . Cabbage     GI Upset    Home Medications:  Medications Prior to Admission  Medication Dose Route Frequency Provider Last Rate Last Dose  . acetaminophen (TYLENOL) tablet 650 mg  650 mg Oral Q4H PRN Scarlette Calico C. [place], [place]      . ALPRAZolam Prudy Feeler) tablet 0.5 mg  0.5 mg Oral BID Scarlette Calico C. [place], [place]   0.5 mg at 08/05/11 0020  . citalopram (CELEXA) tablet 20 mg  20 mg Oral Daily Frances C. [place], [place]      . mulitivitamin with minerals tablet 1 tablet  1 tablet Oral Daily Carleene Cooper III, MD      . nicotine (NICODERM CQ - dosed in mg/24 hours) patch 21 mg  21 mg Transdermal Daily Frances C. [place], [place]      . ondansetron [facility]) tablet 4 mg  4 mg  Oral Q8H PRN Izola Price. [place], [place]      . pantoprazole (PROTONIX) EC tablet 40 mg  40 mg Oral Q1200 Frances C. [place], [place]   40 mg at 08/05/11 0021  . sucralfate (CARAFATE) tablet 1 g  1 g Oral BID Scarlette Calico C. [place], [place]   1 g at 08/05/11 0308  . traMADol (ULTRAM) tablet 50 mg  50 mg Oral Q6H PRN Izola Price. [place], [place]   50 mg at 08/05/11 0308  . zolpidem (AMBIEN) tablet 5 mg  5 mg Oral QHS PRN Izola Price. [place], [place]   5 mg at 08/05/11 0308  . DISCONTD: multivitamin tablet 1 tablet  1 tablet Oral Daily Frances C. [place], [place]       Medications Prior to Admission  Medication Sig Dispense Refill  . ALPRAZolam (XANAX) 0.5 MG tablet Take 0.5 mg by mouth 2 (two) times daily.        . citalopram (CELEXA) 20 MG tablet Take 20 mg by mouth daily.        . Multiple Vitamin  (MULTIVITAMIN) tablet Take 1 tablet by mouth daily.        Marland Kitchen oxycodone (ROXICODONE) 30 MG immediate release tablet Take 30 mg by mouth 4 (four) times daily.          OB/GYN Status:  No LMP for male patient.  General Assessment Data Location of Assessment: [facility] ED Living Arrangements: Alone Can pt return to current living arrangement?: Yes Admission Status: Voluntary Is patient capable of signing voluntary admission?: Yes Transfer from: Acute Hospital Referral Source: Self/Family/Friend  Education Status Is patient currently in school?: No  Risk to self Suicidal Ideation: No Suicidal Intent: No Is patient at risk for suicide?: No Suicidal Plan?: No Access to Means: No What has been your use of drugs/alcohol within the last 12 months?: oxycodone Previous Attempts/Gestures: No How many times?: 0  Other Self Harm Risks: none Triggers for Past Attempts: None known Intentional Self Injurious Behavior: None Family Suicide History: No Recent stressful life event(s): Divorce Persecutory voices/beliefs?: No Depression: Yes Depression Symptoms: Feeling worthless/self pity;Fatigue;Isolating;Despondent Substance abuse history and/or treatment for substance abuse?: Yes Suicide prevention information given to non-admitted patients: Not applicable  Risk to Others Homicidal Ideation: No Thoughts of Harm to Others: No Current Homicidal Intent: No Current Homicidal Plan: No Access to Homicidal Means: No Identified Victim: none History of harm to others?: No Assessment of Violence: None Noted Violent Behavior Description: cooperative Does patient have access to weapons?: No Criminal Charges Pending?: No Does patient have a court date: No  Psychosis Hallucinations: None noted Delusions: None noted  Mental Status Report Appear/Hygiene: Disheveled Eye Contact: Good Motor Activity: Unremarkable Speech: Logical/coherent Level of Consciousness: Alert Mood: Depressed Affect:  Appropriate to circumstance;Depressed Anxiety Level: Minimal Thought Processes: Coherent;Relevant Judgement: Impaired Orientation: Person;Place;Time;Situation Obsessive Compulsive Thoughts/Behaviors: None  Cognitive Functioning Concentration: Normal Memory: Recent Intact;Remote Intact IQ: Average Insight: Good Impulse Control: Poor Appetite: Good Weight Loss: 0  Weight Gain: 0  Sleep: No Change Vegetative Symptoms: Staying in bed  Prior Inpatient Therapy Prior Inpatient Therapy: No Prior Therapy Dates: n/a Prior Therapy Facilty/Provider(s): n/a Reason for Treatment: n/a  Prior Outpatient Therapy Prior Outpatient Therapy: No Prior Therapy Dates: n/a Prior Therapy Facilty/Provider(s): n/a Reason for Treatment: n/a  ADL Screening (condition at time of admission) Patient's cognitive ability adequate to safely complete daily activities?: Yes Patient able to express need for assistance with ADLs?: Yes Independently performs ADLs?: Yes       Abuse/Neglect Assessment (  Assessment to be complete while patient is alone) Physical Abuse: Denies Verbal Abuse: Denies Sexual Abuse: Denies Exploitation of patient/patient's resources: Denies Self-Neglect: Denies Values / Beliefs Cultural Requests During Hospitalization: None Spiritual Requests During Hospitalization: None   Advance Directives (For Healthcare) Advance Directive: Patient does not have advance directive Nutrition Screen Diet: Regular Unintentional weight loss greater than 10lbs within the last month: No Home Tube Feeding or Total Parenteral Nutrition (TPN): No Patient appears severely malnourished: No  Additional Information 1:1 In Past 12 Months?: No CIRT Risk: No Elopement Risk: No Does patient have medical clearance?: Yes     Disposition:  Disposition Disposition of Patient: Referred to;Inpatient treatment program Type of inpatient treatment program: Adult Patient referred to: [facility])  On Site  Evaluation by:   Reviewed with Physician:     Marjean Donna 08/05/2011 6:33 AM

## 2011-08-05 NOTE — Discharge Instructions (Signed)
Please go directly to [facility].  Alcohol Problems Most adults who drink alcohol drink in moderation (not a lot) are at low risk for developing problems related to their drinking. However, all drinkers, including low-risk drinkers, should know about the health risks connected with drinking alcohol. RECOMMENDATIONS FOR LOW-RISK DRINKING  Drink in moderation. Moderate drinking is defined as follows:   Men - no more than 2 drinks per day.   Nonpregnant women - no more than 1 drink per day.   Over age 65 - no more than 1 drink per day.  A standard drink is 12 grams of pure alcohol, which is equal to a 12 ounce bottle of beer or wine cooler, a 5 ounce glass of wine, or 1.5 ounces of distilled spirits (such as whiskey, brandy, vodka, or rum).  ABSTAIN FROM (DO NOT DRINK) ALCOHOL:  When pregnant or considering pregnancy.   When taking a medication that interacts with alcohol.   If you are alcohol dependent.   A medical condition that prohibits drinking alcohol (such as ulcer, liver disease, or heart disease).  DISCUSS WITH YOUR CAREGIVER:  If you are at risk for coronary heart disease, discuss the potential benefits and risks of alcohol use: Light to moderate drinking is associated with lower rates of coronary heart disease in certain populations (for example, men over age 61 and postmenopausal women). Infrequent or nondrinkers are advised not to begin light to moderate drinking to reduce the risk of coronary heart disease so as to avoid creating an alcohol-related problem. Similar protective effects can likely be gained through proper diet and exercise.   Women and the elderly have smaller amounts of body water than men. As a result women and the elderly achieve a higher blood alcohol concentration after drinking the same amount of alcohol.   Exposing a fetus to alcohol can cause a broad range of birth defects referred to as Fetal Alcohol Syndrome (FAS) or Alcohol-Related Birth Defects (ARBD).  Although FAS/ARBD is connected with excessive alcohol consumption during pregnancy, studies also have reported neurobehavioral problems in infants born to mothers reporting drinking an average of 1 drink per day during pregnancy.   Heavier drinking (the consumption of more than 4 drinks per occasion by men and more than 3 drinks per occasion by women) impairs learning (cognitive) and psychomotor functions and increases the risk of alcohol-related problems, including accidents and injuries.  CAGE QUESTIONS:   Have you ever felt that you should Cut down on your drinking?   Have people Annoyed you by criticizing your drinking?   Have you ever felt bad or Guilty about your drinking?   Have you ever had a drink first thing in the morning to steady your nerves or get rid of a hangover (Eye opener)?  If you answered positively to any of these questions: You may be at risk for alcohol-related problems if alcohol consumption is:   Men: Greater than 14 drinks per week or more than 4 drinks per occasion.   Women: Greater than 7 drinks per week or more than 3 drinks per occasion.  Do you or your family have a medical history of alcohol-related problems, such as:  Blackouts.   Sexual dysfunction.   Depression.   Trauma.   Liver dysfunction.   Sleep disorders.   Hypertension.   Chronic abdominal pain.   Has your drinking ever caused you problems, such as problems with your family, problems with your work (or school) performance, or accidents/injuries?   Do you have a  compulsion to drink or a preoccupation with drinking?   Do you have poor control or are you unable to stop drinking once you have started?   Do you have to drink to avoid withdrawal symptoms?   Do you have problems with withdrawal such as tremors, nausea, sweats, or mood disturbances?   Does it take more alcohol than in the past to get you high?   Do you feel a strong urge to drink?   Do you change your plans so that  you can have a drink?   Do you ever drink in the morning to relieve the shakes or a hangover?  If you have answered a number of the previous questions positively, it may be time for you to talk to your caregivers, family, and friends and see if they think you have a problem. Alcoholism is a chemical dependency that keeps getting worse and will eventually destroy your health and relationships. Many alcoholics end up dead, impoverished, or in prison. This is often the end result of all chemical dependency.  Do not be discouraged if you are not ready to take action immediately.   Decisions to change behavior often involve up and down desires to change and feeling like you cannot decide.   Try to think more seriously about your drinking behavior.   Think of the reasons to quit.  WHERE TO GO FOR ADDITIONAL INFORMATION   The National Institute on Alcohol Abuse and Alcoholism (NIAAA)www.niaaa.nih.gov   [employer] on Alcoholism and Drug Dependence (NCADD)www.ncadd.org   American Society of Addiction Medicine (ASAM)www.https://anderson-johnson.com/  Document Released: 04/26/2005 Document Revised: 04/15/2011 Document Reviewed: 12/13/2007 [facility] Patient Information 2012 [place], [place].

## 2011-08-05 NOTE — ED Provider Notes (Signed)
Patient is to be discharged to  [facility].  Nat Christen, MD 08/05/11 [phone]

## 2011-08-06 NOTE — ED Provider Notes (Signed)
Medical screening examination/treatment/procedure(s) were performed by non-physician practitioner and as supervising physician I was immediately available for consultation/collaboration.   Carleene Cooper III, MD 08/06/11 [phone]

## 2013-12-03 ENCOUNTER — Encounter (HOSPITAL_COMMUNITY): Payer: Self-pay | Admitting: Emergency Medicine

## 2013-12-03 ENCOUNTER — Emergency Department (HOSPITAL_COMMUNITY)
Admission: EM | Admit: 2013-12-03 | Discharge: 2013-12-03 | Disposition: A | Discharge status: Home / Self Care | Payer: Self-pay | Attending: Emergency Medicine | Admitting: Emergency Medicine

## 2013-12-03 DIAGNOSIS — Z8739 Personal history of other diseases of the musculoskeletal system and connective tissue: Secondary | ICD-10-CM | POA: Insufficient documentation

## 2013-12-03 DIAGNOSIS — Z8719 Personal history of other diseases of the digestive system: Secondary | ICD-10-CM | POA: Insufficient documentation

## 2013-12-03 DIAGNOSIS — Z8619 Personal history of other infectious and parasitic diseases: Secondary | ICD-10-CM | POA: Insufficient documentation

## 2013-12-03 DIAGNOSIS — G894 Chronic pain syndrome: Secondary | ICD-10-CM | POA: Insufficient documentation

## 2013-12-03 DIAGNOSIS — F17200 Nicotine dependence, unspecified, uncomplicated: Secondary | ICD-10-CM | POA: Insufficient documentation

## 2013-12-03 DIAGNOSIS — Z79899 Other long term (current) drug therapy: Secondary | ICD-10-CM | POA: Insufficient documentation

## 2013-12-03 LAB — COMPREHENSIVE METABOLIC PANEL
ALT: 11 U/L (ref 0–53)
ANION GAP: 15 (ref 5–15)
AST: 15 U/L (ref 0–37)
Albumin: 4.1 g/dL (ref 3.5–5.2)
Alkaline Phosphatase: 53 U/L (ref 39–117)
BUN: 5 mg/dL — AB (ref 6–23)
CALCIUM: 9.4 mg/dL (ref 8.4–10.5)
CO2: 22 meq/L (ref 19–32)
CREATININE: 0.7 mg/dL (ref 0.50–1.35)
Chloride: 98 mEq/L (ref 96–112)
GLUCOSE: 100 mg/dL — AB (ref 70–99)
Potassium: 3.9 mEq/L (ref 3.7–5.3)
Sodium: 135 mEq/L — ABNORMAL LOW (ref 137–147)
TOTAL PROTEIN: 7.5 g/dL (ref 6.0–8.3)
Total Bilirubin: 0.4 mg/dL (ref 0.3–1.2)

## 2013-12-03 LAB — CBC
HEMATOCRIT: 45.4 % (ref 39.0–52.0)
HEMOGLOBIN: 15.8 g/dL (ref 13.0–17.0)
MCH: 26.1 pg (ref 26.0–34.0)
MCHC: 34.8 g/dL (ref 30.0–36.0)
MCV: 75 fL — AB (ref 78.0–100.0)
Platelets: 261 10*3/uL (ref 150–400)
RBC: 6.05 MIL/uL — ABNORMAL HIGH (ref 4.22–5.81)
RDW: 14.2 % (ref 11.5–15.5)
WBC: 5.4 10*3/uL (ref 4.0–10.5)

## 2013-12-03 LAB — RAPID URINE DRUG SCREEN, HOSP PERFORMED
AMPHETAMINES: NOT DETECTED
BENZODIAZEPINES: NOT DETECTED
Barbiturates: NOT DETECTED
COCAINE: NOT DETECTED
OPIATES: NOT DETECTED
Tetrahydrocannabinol: NOT DETECTED

## 2013-12-03 LAB — I-STAT TROPONIN, ED: Troponin i, poc: 0 ng/mL (ref 0.00–0.08)

## 2013-12-03 LAB — SALICYLATE LEVEL

## 2013-12-03 LAB — ACETAMINOPHEN LEVEL: Acetaminophen (Tylenol), Serum: <15 ug/mL (ref 10–30)

## 2013-12-03 LAB — ETHANOL: Alcohol, Ethyl (B): <11 mg/dL (ref 0–11)

## 2013-12-03 NOTE — ED Notes (Signed)
Pt c/o generalized abd pain, right side pain, left upper chest pain and back pain > 5 years ago. Pt sts he is here because he has ran out of his pain medicine and needs something for the pain. Pt sts he is unsure if he wants help getting off the pain medicine, sts he tried it once before but the pain continued so he started taking them again. Denies going to a pain management clinic. Denies ETOH/drug use. sts he did try his friend's medicine yesterday because of the pain. Nad, skin warm and dry, resp e/u.

## 2013-12-03 NOTE — Discharge Instructions (Signed)
°Emergency Department Resource Guide °1) Find a Doctor and Pay Out of Pocket °Although you won't have to find out who is covered by your insurance plan, it is a good idea to ask around and get recommendations. You will then need to call the office and see if the doctor you have chosen will accept you as a new patient and what types of options they offer for patients who are self-pay. Some doctors offer discounts or will set up payment plans for their patients who do not have insurance, but you will need to ask so you aren't surprised when you get to your appointment. ° °2) Contact Your Local Health Department °Not all health departments have doctors that can see patients for sick visits, but many do, so it is worth a call to see if yours does. If you don't know where your local health department is, you can check in your phone book. The CDC also has a tool to help you locate your state's health department, and many state websites also have listings of all of their local health departments. ° °3) Find a Walk-in Clinic °If your illness is not likely to be very severe or complicated, you may want to try a walk in clinic. These are popping up all over the country in pharmacies, drugstores, and shopping centers. They're usually staffed by nurse practitioners or physician assistants that have been trained to treat common illnesses and complaints. They're usually fairly quick and inexpensive. However, if you have serious medical issues or chronic medical problems, these are probably not your best option. ° °No Primary Care Doctor: °- Call [facility] at  832-8000 - they can help you locate a primary care doctor that  accepts your insurance, provides certain services, etc. °- Physician Referral Service- 1-800-533-3463 ° °Chronic Pain Problems: °Organization         Address  Phone   Notes  °[facility]  (336) 297-2271 Patients need to be referred by their primary care doctor.  ° °Medication  Assistance: °Organization         Address  Phone   Notes  °[place] [facility] [address] °[place], [place] [address] (336) 641-8030 --Must be a resident of [place] °-- Must have NO insurance coverage whatsoever (no Medicaid/ Medicare, etc.) °-- The pt. MUST have a primary care doctor that directs their care regularly and follows them in the community °  °[facility]  (866) 331-1348   °[employer]  (888) 892-1162   ° °Agencies that provide inexpensive medical care: °Organization         Address  Phone   Notes  °[facility]  (336) 832-8035   °[facility]    (336) 832-7272   °[facility] [address] °[place], [place] [address] (336) 832-4777   °[facility] [address], °[place] (336) 271-4999   °[facility]    (336) 373-0678   °[facility]    (336) 272-1050   °[facility] ° [address], [place] Phone:  (336) 832-4444, Fax:  (336) 832-4440 Hours of Operation:  9 am - 6 pm, M-F.  Also accepts Medicaid/Medicare and self-pay.  °[facility] ° [address], [place] Phone: (336) 832-3150, Fax: (336) 832-3151. Hours of Operation:  8:30 am - 5:30 pm, M-F.  Also accepts Medicaid and self-pay.  °[facility] [place] [address], [place] Phone: (336) 878-6027   °[facility] [address], [place], [place] (336)723-1848, Ext. 123 Mondays & Thursdays: 7-9 AM.  First 15 patients are seen on a first come, first serve basis. °  ° °Medicaid-accepting [place] Providers: ° °Organization         Address  Phone   Notes  °[facility] [address], [place] (336) 641-2100 Also accepts self-pay patients.  °[facility] [address], [place] ° (336) 856-9996   °[facility] [address], [place]  (336) 288-8857   °[facility] [address] [place] [address], [place] (336) 299-7000   °Veita Bland [address], [place]  ° (336) 373-1557 Only accepts [place] Access Medicaid patients after they have their name applied to their card.  ° °Self-Pay (no insurance) in [place]: ° °Organization         Address  Phone   Notes  °Sickle Cell Patients, [facility] [address], [place] (336) 832-1970   °[facility] [address], [place] (336) 832-4400   °[facility] ° [address], [place] (336) 992-4800   °[facility]/Dr. Osei-Bonsu ° [address] [place] [address], [place] or [address], [place] (336) 841-8500 Phone number for both [place] and [place] locations is the same.  °[facility] [address], [place] (336) 299-0000   °[facility] [address] [place] [address], [place] or [address] (336) 852-7530 °(336) 878-2260   °[facility] [address], [place] (336) 350-1642, phone; (336) 294-5005, fax Sees patients 1st and 3rd Saturday of every month.  Must not qualify for public or private insurance (i.e. Medicaid, Medicare, [place] Health Choice, Veterans' Benefits) • Household income should be no more than 200% of the poverty level •The clinic cannot treat you if you are pregnant or think you are pregnant • Sexually transmitted diseases are not treated at the clinic.  ° ° °Dental Care: °Organization         Address  Phone  Notes  °[place] [facility] [address],  (336) 641-6152 Accepts children up to age 21 who are enrolled in Medicaid or [place] Health Choice; pregnant women with a Medicaid card; and children who have applied for Medicaid or [place] Health Choice, but were declined, whose parents can pay a reduced fee at time of service.  °[place]  [facility] [place]  [address], [place] (336) 641-7733 Accepts children up to age 21 who are enrolled in Medicaid or [place] Health Choice; pregnant women with a Medicaid card; and children who have applied for Medicaid or  Health Choice, but were declined, whose parents can pay a reduced fee at time of service.  °[facility] ° [address],  (336) 641-4533 Patients are seen by appointment only. Walk-ins are not accepted. [facility] will see patients 18 years of age and older. °Monday - Tuesday (8am-5pm) °Most Wednesdays (8:30-5pm) °$30 per visit, cash only  °[facility] ° [address], [place] (336) 641-4533 Patients are seen by appointment only. Walk-ins are not accepted. [facility] will see patients 18 years of age and older. °One   Wednesday Evening (Monthly: Volunteer Based).  $30 per visit, cash only  °[facility]  (919) 537-3737 for adults; Children under age 4, call Graduate Pediatric Dentistry at (919) [phone]. Children aged 4-14, please call (919) [phone] to request a pediatric application. ° Dental services are provided in all areas of dental care including fillings, crowns and bridges, complete and partial dentures, implants, gum treatment, root canals, and extractions. Preventive care is also provided. Treatment is provided to both adults and children. °Patients are selected via a lottery and there is often a waiting list. °  °[facility] [address], °[place] ° (336) 763-8833 [email or web address] °  °[facility] [address], [place], [place] (336)723-1848, Ext. 123 Second and Fourth Thursday of each month, opens at 6:30 AM; Clinic ends at 9 AM.  Patients are seen on a first-come first-served basis, and a limited number are seen during each clinic.  ° °[facility] ° [address], [place], [place] (336) 723-7904    Eligibility Requirements °You must have lived in [place], [place], or [place] counties for at least the last three months. °  You cannot be eligible for state or federal sponsored healthcare insurance, including Veterans Administration, Medicaid, or Medicare. °  You generally cannot be eligible for healthcare insurance through your employer.  °  How to apply: °Eligibility screenings are held every Tuesday and Wednesday afternoon from 1:00 pm until 4:00 pm. You do not need an appointment for the interview!  °[facility] [address], [place], [place] 336-631-2330   °[facility] [facility]  336-342-8273   °[facility]  336-703-3100   °[facility]  336-570-6415   ° °Behavioral Health Resources in the Community: °Intensive Outpatient Programs °Organization         Address  Phone  Notes  °[facility] [address], [place], [place] 336-878-6098   °[facility] [address], [place], [place] 336-832-9800   °[facility] [address], [place], [place] ° 336-882-2125   °[facility] [address],  °[place], [place] 1-800-853-5163 or 336-641-4981   °Substance Abuse Resources °Organization         Address  Phone  Notes  °[facility]  336-882-2125   °[facility]  336-784-9470   °[facility]  336-285-9073   °[facility]  336-845-3988   °Residential & Outpatient Substance Abuse Program  1-800-659-3381   °Psychological Services °Organization         Address  Phone  Notes  °[facility]  336- 832-9600   °[facility]  336- 378-7881   °[facility] [address], [place] 1-800-853-5163 or 336-641-4981   ° °Mobile Crisis Teams °Organization         Address  Phone  Notes  °[facility], Mobile Crisis Care Unit  1-877-626-1772   °Assertive °[facility] ° [address]  [place], [place] 336-834-9664   °Sharon DeEsch [address] °[place] 336-554-5454   ° °Self-Help/Support Groups °Organization         Address  Phone             Notes  °[facility]  - variety of support groups  336- 373-1402 Call for more information  °Narcotics Anonymous (NA), [facility] [address], °[place]   2 meetings at this location  ° °  Residential Treatment Programs °Organization         Address  Phone  Notes  °[facility] [address],    °[place]  1-866-801-8205   °[facility] ° [address], [place], [place] 704-293-8524   °[facility] [address], [place] 336-845-3988 Admissions: 8am-3pm M-F  °[facility] [address]    °[place], [place] 336-841-1104   °[facility] [address], [place], [place] 336-379-7146   °[facility] [address]  °[place], [place] 336-285-9073   °[facility] - Intensive Outpatient [address], [place], [place] 336-852-3033   °[facility] [address]  °[place], [place] 1-877-615-2722 or 336-784-9470   °[facility] [address] [place], [place] 336-227-7417 Accepts Medicaid  °[facility] [address]  °[place] 1-800-659-3381 Substance Abuse/Addiction Treatment  ° °[place] Behavioral Health Resources °Organization         Address  Phone  Notes  °[facility]  (888) 581-9988   °Julie Brannon, PhD [address] [place], [place]   (336) 349-5553 or (336) 951-0000   °[facility]   [address] °[place], [place] (336) 349-4454   °[facility] [address], [place], [place] (336) 342-8316 Insurance/Medicaid/sponsorship through [facility]  °[facility] [address]                                    [place], [place] (336) 342-8316 Therapy/tele-psych/case    °[facility] [address]  ° [place], [place] (336) 349-2233    °Dr. Arfeen  (336) 349-4544   °[facility] [place]  [employer] [place] [facility] 1) [address], [place] °2) [address], [place] °3)  [address], [place] (336) 349-3220 °(336) 342-7768 ° °(336) 342-8140   °[place] [facility] (336) 342-1394 or (336) 342-3537 (After Hours)    ° ° °

## 2013-12-03 NOTE — ED Notes (Signed)
Resident at bedside.  

## 2013-12-03 NOTE — ED Notes (Signed)
Brought pt back to room; pt getting undressed and into a gown at this time; Hilaria Ota, EMT aware pt is in room

## 2013-12-03 NOTE — ED Notes (Signed)
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 Patient reports he's been out of his Xanax and Oxycodone for 2 days and cannot get refills from his PCP due to taking them faster than prescribed. Patient states he's having chest and abdominal pain with nausea and dizziness. Patient denies ETOH and illegal drug abuse.

## 2013-12-04 NOTE — ED Provider Notes (Signed)
CSN: [id number]     Arrival date & time 12/03/13  1525 History   First MD Initiated Contact with Patient 12/03/13 1817     Chief Complaint  Patient presents with  . Addiction Problem     (Consider location/radiation/quality/duration/timing/severity/associated sxs/prior Treatment) Patient is a 48 y.o. male presenting with chest pain and abdominal pain.  Chest Pain Pain location:  Unable to specify Pain radiates to:  Does not radiate Pain severity:  Severe Onset quality:  Gradual Timing:  Constant Chronicity:  Chronic Associated symptoms: abdominal pain and back pain   Associated symptoms: no cough, no fever, no headache, no nausea, no shortness of breath and not vomiting   Abdominal Pain Pain location:  Generalized Pain severity:  Severe Onset quality:  Gradual Associated symptoms: chest pain   Associated symptoms: no chills, no cough, no dysuria, no fever, no nausea, no shortness of breath, no sore throat and no vomiting     Past Medical History  Diagnosis Date  . Hepatitis C   . Ruptured disk   . IBS (irritable bowel syndrome)    Past Surgical History  Procedure Laterality Date  . Back surgery    . Hernia repair     History reviewed. No pertinent family history. History  Substance Use Topics  . Smoking status: Current Every Day Smoker    Types: Cigarettes  . Smokeless tobacco: Not on file  . Alcohol Use: Yes    Review of Systems  Constitutional: Negative for fever and chills.  HENT: Negative for sore throat.   Eyes: Negative for pain.  Respiratory: Negative for cough and shortness of breath.   Cardiovascular: Positive for chest pain.  Gastrointestinal: Positive for abdominal pain. Negative for nausea and vomiting.  Genitourinary: Negative for dysuria and flank pain.  Musculoskeletal: Positive for back pain. Negative for neck pain.  Skin: Negative for rash.  Neurological: Negative for seizures and headaches.      Allergies  Aspirin and Cabbage  Home  Medications   Prior to Admission medications   Medication Sig Start Date End Date Taking? Authorizing Provider  ALPRAZolam Duanne Moron) 0.5 MG tablet Take 0.5 mg by mouth 3 (three) times daily.    Yes Historical Provider, MD  famotidine (PEPCID) 20 MG tablet Take 20 mg by mouth 3 (three) times daily.   Yes Historical Provider, MD  hydrocortisone cream (PREPARATION H HYDROCORTISONE) 1 % Apply 1 application topically daily as needed for itching.   Yes Historical Provider, MD  omeprazole (PRILOSEC) 40 MG capsule Take 40 mg by mouth daily as needed (for acid reflux).    Yes Historical Provider, MD  oxyCODONE (ROXICODONE) 15 MG immediate release tablet Take 15 mg by mouth 4 (four) times daily.   Yes Historical Provider, MD   BP 125/76  Pulse 92  Temp(Src) 97.9 F (36.6 C) (Oral)  Resp 18  SpO2 100% Physical Exam  Constitutional: He is oriented to person, place, and time. He appears well-developed and well-nourished. No distress.  HENT:  Head: Normocephalic and atraumatic.  Eyes: Pupils are equal, round, and reactive to light.  Neck: Normal range of motion.  Cardiovascular: Normal rate and regular rhythm.   Pulmonary/Chest: Effort normal and breath sounds normal.  Abdominal: Soft. He exhibits no distension. There is no tenderness.  Musculoskeletal: Normal range of motion.  Neurological: He is alert and oriented to person, place, and time.  Skin: Skin is warm. He is not diaphoretic.    ED Course  Procedures (including critical care time) Labs Review  Labs Reviewed  CBC - Abnormal; Notable for the following:    RBC 6.05 (*)    MCV 75.0 (*)    All other components within normal limits  COMPREHENSIVE METABOLIC PANEL - Abnormal; Notable for the following:    Sodium 135 (*)    Glucose, Bld 100 (*)    BUN 5 (*)    All other components within normal limits  SALICYLATE LEVEL - Abnormal; Notable for the following:    Salicylate Lvl <2.9 (*)    All other components within normal limits   ACETAMINOPHEN LEVEL  ETHANOL  URINE RAPID DRUG SCREEN (HOSP PERFORMED)  I-STAT TROPOININ, ED    Imaging Review No results found.   EKG Interpretation None      MDM   Final diagnoses:  Chronic pain syndrome   48 year old male with a history of previous ruptured disc, previous back surgery, chronic abdominal pain, chronic chest pain who presents after running out of his chronic pain medications.  Upon arrival here the patient is hemodynamically stable and in no acute distress. Patient's exam is unremarkable as documented above. Patient's chest pain is often relieved by his anxiolytics, of which he has run out. According to the patient, he takes 15 mg of oxycodone 4 times daily and takes 0.5 mg Xanax 3 times daily. Patient has been out of his medications for 2 days and has been taking them more often than prescribed. For this reason, he is not willing to followup with his regular care Dr. as he is afraid he will no longer be prescribed pain medications and angulated.  Patient's basic labs here are unremarkable. His troponin is negative. His EKG is normal appearing. Given the patient's chronic pain syndrome, I doubt ACS at this time. I doubt PE, pneumonia, esophagitis, dissection, appendicitis, pancreatitis, diverticulitis.  Patient discharged in stable condition. Instructed to followup with his primary care physician. No new medication prescriptions were given at this time. Patient seen and evaluated by myself and by the attending Dr. Tomi Bamberger.    Freddi Che, MD 12/04/13 0005

## 2013-12-05 NOTE — ED Provider Notes (Signed)
I saw and evaluated the patient, reviewed the resident's note and I agree with the findings and plan.   EKG Interpretation   Date/Time:  Monday December 03 2013 15:50:21 EDT Ventricular Rate:  81 PR Interval:  154 QRS Duration: 84 QT Interval:  376 QTC Calculation: 436 R Axis:   42 Text Interpretation:  Normal sinus rhythm Normal ECG No significant change  since last tracing Reconfirmed by Reinhart Saulters  MD-J, Perrin Eddleman ([id number]) on 12/05/2013  6:33:23 PM     Encouraged pt to follow up with his PCP regarding his chronic pain medications.   Dorie Rank, MD 12/05/13 [phone]

## 2014-07-08 ENCOUNTER — Other Ambulatory Visit (INDEPENDENT_AMBULATORY_CARE_PROVIDER_SITE_OTHER): Payer: Self-pay | Admitting: Surgery

## 2014-07-08 NOTE — H&P (Signed)
Brandon Ali 07/08/2014 8:47 AM Location: [facility] Patient #: [id number] DOB: 04/18/1966 Divorced / Language: [name] / Race: Undefined Male History of Present Illness Brandon Hector MD; 07/08/2014 9:53 AM) The patient is a 49 year old male who presents with hemorrhoids. Patient sent by nurse practitioner Brandon Ali at [facility]. Patient complains of groin pain and hemorrhoids. Pleasant smoking male. Originally from [place]. Struggles with intermittent constipation and bloating. Has used fiber supplements intermittently. Has struggled with anal pain consistent with hemorrhoids for many years. His never had to have any interventions. Has tried creams and suppositories. Feels like it is a worsening problem. Wished surgical evaluation. Occasionally has bowel movements every day but sometimes can be a few days. History of gastritis. Helicobacter pylori. Has been on rounds of antibiotics. That was years ago. No new major issues. On proton pump inhibitor. Follow-up by Dr. Ardis Ali with [name] gastroenterology in the past. History of hepatitis C but stable. Patient also notes he has pain in his LEFT testicle. Think he's had a history of urinary tract infections in the past. Not recently. The struggle with some occasional bloating and discomfort. Never had a colonoscopy. No history of Crohn's or ulcerative colitis that he is aware of. No family history of this either. He smokes about a pack today. No history of heart attacks or strokes. Can walk about 20 or 30 minutes without difficulty. Other Problems Brandon Mage Spillers, MA; 07/08/2014 8:47 AM) Anxiety Disorder Arthritis Back Pain Chest pain Depression Gastroesophageal Reflux Disease Hemorrhoids Hepatitis High blood pressure Migraine Headache  Past Surgical History Brandon Regulus, MA; 07/08/2014 8:47 AM) Laparoscopic Inguinal Hernia Surgery Left. Oral Surgery Spinal  Surgery - Neck  Diagnostic Studies History Brandon Ali, [name]; 07/08/2014 8:47 AM) Colonoscopy never  Allergies Brandon Mage Spillers, MA; 07/08/2014 8:49 AM) Aspirin *ANALGESICS - NonNarcotic*  Medication History (Brandon Spillers, MA; 07/08/2014 8:50 AM) Xanax (0.5MG  Tablet, Oral) Active. Pepcid (20MG  Tablet, Oral) Active. PriLOSEC (40MG  Capsule DR, Oral) Active. OxyCODONE HCl (15MG  Tablet, Oral) Active. Medications Reconciled  Social History Brandon Ali, [name]; 07/08/2014 8:47 AM) Caffeine use Coffee, Tea. No alcohol use No drug use Tobacco use Current every day smoker.  Family History Brandon Ali, [name]; 07/08/2014 8:47 AM) Arthritis Mother. Cervical Cancer Sister. Hypertension Mother. Ischemic Bowel Disease Sister. Kidney Disease Brother.     Review of Systems (Brandon [name] MA; 07/08/2014 8:47 AM) General Present- Appetite Loss, Chills, Fatigue, Fever and Weight Loss. Not Present- Night Sweats and Weight Gain. Skin Present- Hives and Rash. Not Present- Change in Wart/Mole, Dryness, Jaundice, New Lesions, Non-Healing Wounds and Ulcer. HEENT Present- Oral Ulcers, Seasonal Allergies, Sore Throat and Visual Disturbances. Not Present- Earache, Hearing Loss, Hoarseness, Nose Bleed, Ringing in the Ears, Sinus Pain, Wears glasses/contact lenses and Yellow Eyes. Respiratory Not Present- Bloody sputum, Chronic Cough, Difficulty Breathing, Snoring and Wheezing. Breast Not Present- Breast Mass, Breast Pain, Nipple Discharge and Skin Changes. Cardiovascular Present- Chest Pain, Leg Cramps and Rapid Heart Rate. Not Present- Difficulty Breathing Lying Down, Palpitations, Shortness of Breath and Swelling of Extremities. Gastrointestinal Present- Abdominal Pain, Bloating, Change in Bowel Habits, Constipation, Excessive gas, Hemorrhoids, Indigestion, Nausea and Vomiting. Not Present- Bloody Stool, Chronic diarrhea, Difficulty Swallowing, Gets full quickly at meals and Rectal  Pain. Male Genitourinary Present- Frequency and Urgency. Not Present- Nocturia, Painful Urination and Pelvic Pain. Musculoskeletal Present- Back Pain, Joint Pain, Muscle Pain and Muscle Weakness. Not Present- Joint Stiffness and Swelling of Extremities. Neurological Present- Decreased Memory, Headaches and Weakness. Not Present- Fainting,  Numbness, Seizures, Tingling, Tremor and Trouble walking. Psychiatric Present- Anxiety, Change in Sleep Pattern and Depression. Not Present- Bipolar, Fearful and Frequent crying.  Vitals (Brandon Spillers MA; 07/08/2014 8:48 AM) 07/08/2014 8:48 AM Weight: 174 lb Height: 68in Body Surface Area: 1.95 m Body Mass Index: 26.46 kg/m Pulse: 72 (Regular)  BP: 132/82 (Sitting, Left Arm, Standard)     Physical Exam Brandon Hector MD; 07/08/2014 9:48 AM)  General Mental Status-Alert. General Appearance-Not in acute distress, Not Sickly. Orientation-Oriented X3. Hydration-Well hydrated. Voice-Normal.  Integumentary Global Assessment Upon inspection and palpation of skin surfaces of the - Axillae: non-tender, no inflammation or ulceration, no drainage. and Distribution of scalp and body hair is normal. General Characteristics Temperature - normal warmth is noted.  Head and Neck Head-normocephalic, atraumatic with no lesions or palpable masses. Face Global Assessment - atraumatic, no absence of expression. Neck Global Assessment - no abnormal movements, no bruit auscultated on the right, no bruit auscultated on the left, no decreased range of motion, non-tender. Trachea-midline. Thyroid Gland Characteristics - non-tender.  Eye Eyeball - Left-Extraocular movements intact, No Nystagmus. Eyeball - Right-Extraocular movements intact, No Nystagmus. Cornea - Left-No Hazy. Cornea - Right-No Hazy. Sclera/Conjunctiva - Left-No scleral icterus, No Discharge. Sclera/Conjunctiva - Right-No scleral icterus, No  Discharge. Pupil - Left-Direct reaction to light normal. Pupil - Right-Direct reaction to light normal.  ENMT Ears Pinna - Left - no drainage observed, no generalized tenderness observed. Right - no drainage observed, no generalized tenderness observed. Nose and Sinuses External Inspection of the Nose - no destructive lesion observed. Inspection of the nares - Left - quiet respiration. Right - quiet respiration. Mouth and Throat Lips - Upper Lip - no fissures observed, no pallor noted. Lower Lip - no fissures observed, no pallor noted. Nasopharynx - no discharge present. Oral Cavity/Oropharynx - Tongue - no dryness observed. Oral Mucosa - no cyanosis observed. Hypopharynx - no evidence of airway distress observed.  Chest and Lung Exam Inspection Movements - Normal and Symmetrical. Accessory muscles - No use of accessory muscles in breathing. Palpation Palpation of the chest reveals - Non-tender. Auscultation Breath sounds - Normal and Clear.  Cardiovascular Auscultation Rhythm - Regular. Murmurs & Other Heart Sounds - Auscultation of the heart reveals - No Murmurs and No Systolic Clicks.  Abdomen Inspection Inspection of the abdomen reveals - No Visible peristalsis and No Abnormal pulsations. Umbilicus - No Bleeding, No Urine drainage. Palpation/Percussion Palpation and Percussion of the abdomen reveal - Soft, Non Tender, No Rebound tenderness, No Rigidity (guarding) and No Cutaneous hyperesthesia. Note: Mildly distended but soft. Mild epigastric discomfort. Not severe. No diastases. No umbilical hernia.   Male Genitourinary Note: Circumcised male. Spermatic cords normal. RIGHT testes normal. Obviously enlarged LEFT epididymis. Rather tender. Testicle on LEFT side without any masses.  Obvious indirect inguinal hernia on LEFT side. RIGHT noticeable with Valsalva as well.   Rectal Note: Rather hairy. No pilonidal disease. No pruritus. Normal sphincter tone. No external  hemorrhoids. No condyloma. No fissure. No fistula.  Inflamed internal hemorrhoids. Some mild bleeding. RIGHT posterior and LEFT lateral. RIGHT anterior milder   Peripheral Vascular Upper Extremity Inspection - Left - No Cyanotic nailbeds, Not Ischemic. Right - No Cyanotic nailbeds, Not Ischemic.  Neurologic Neurologic evaluation reveals -normal attention span and ability to concentrate, able to name objects and repeat phrases. Appropriate fund of knowledge , normal sensation and normal coordination. Mental Status Affect - not angry, not paranoid. Cranial Nerves-Normal Bilaterally. Gait-Normal.  Neuropsychiatric Mental status exam performed with  findings of-able to articulate well with normal speech/language, rate, volume and coherence, thought content normal with ability to perform basic computations and apply abstract reasoning and no evidence of hallucinations, delusions, obsessions or homicidal/suicidal ideation.  Musculoskeletal Global Assessment Spine, Ribs and Pelvis - no instability, subluxation or laxity. Right Upper Extremity - no instability, subluxation or laxity.  Lymphatic Head & Neck  General Head & Neck Lymphatics: Bilateral - Description - No Localized lymphadenopathy. Axillary  General Axillary Region: Bilateral - Description - No Localized lymphadenopathy. Femoral & Inguinal  Generalized Femoral & Inguinal Lymphatics: Left - Description - No Localized lymphadenopathy. Right - Description - No Localized lymphadenopathy.   Results Brandon Hector MD; 07/08/2014 9:54 AM) Procedures  Name Value Date Hemorrhoids Procedure Internal exam: Internal Hemorroids ( non-bleeding) Internal Hemorrhoids (bleeding) *Procedure: banded Other: The anatomy & physiology of the anorectal region was discussed. The pathophysiology of hemorrhoids and differential diagnosis was discussed. Natural history progression was discussed. I stressed the importance  of a bowel regimen to have daily soft bowel movements to minimize progression of disease.  The patient's symptoms are not adequately controlled. Therefore, I recommended banding to treat the hemorrhoids. I went over the technique, risks, benefits, and alternatives. Goals of post-operative recovery were discussed as well. Questions were answered. The patient expressed understanding & wished to proceed. The patient was positioned in the lateral decubitus position. Perianal & rectal examination was done. Using anoscopy, I ligated the hemorrhoids above the dentate line with banding. LEFT lateral and RIGHT posterior piles. The patient tolerated the procedure well. Educational handouts further explaining the pathology, treatment options, and bowel regimen were given as well.  Performed: 07/08/2014 9:34 AM    Assessment & Plan Brandon Hector MD; 07/08/2014 9:54 AM)  EPIDIDYMITIS (604.90  N45.1) Impression: Swollen LEFT epididymis. Tender. Suspicious for epididymitis.  Detrol of ciprofloxacin. 500 mg by mouth twice a day for 7 days. If that does not resolve things, consider urology evaluation.  Current Plans Started Cipro 500MG , 1 (one) Tablet two times daily, #14, 07/08/2014, No Refill. BILATERAL INGUINAL HERNIA WITHOUT OBSTRUCTION OR GANGRENE, RECURRENCE NOT SPECIFIED (550.92  K40.20) Impression: LEFT greater than RIGHT inguinal hernias. LEFT inguinal hernia rather sensitive. He would benefit from surgery to repair these at some point. Would make sure his epididymitis and is hemorrhoid issues under better control.  He is interested in considering this. Give information and consider planning surgery in a few months:  The anatomy & physiology of the abdominal wall and pelvic floor was discussed. The pathophysiology of hernias in the inguinal and pelvic region was discussed. Natural history risks such as progressive enlargement, pain, incarceration & strangulation was discussed.  Contributors to complications such as smoking, obesity, diabetes, prior surgery, etc were discussed.  I feel the risks of no intervention will lead to serious problems that outweigh the operative risks; therefore, I recommended surgery to reduce and repair the hernia. I explained laparoscopic techniques with possible need for an open approach. I noted usual use of mesh to patch and/or buttress hernia repair  Risks such as bleeding, infection, abscess, need for further treatment, heart attack, death, and other risks were discussed. I noted a good likelihood this will help address the problem. Goals of post-operative recovery were discussed as well. Possibility that this will not correct all symptoms was explained. I stressed the importance of low-impact activity, aggressive pain control, avoiding constipation, & not pushing through pain to minimize risk of post-operative chronic pain or injury. Possibility of reherniation was discussed.  We will work to minimize complications.  An educational handout further explaining the pathology & treatment options was given as well. Questions were answered. The patient expresses understanding & wishes to proceed with surgery.  Current Plans Schedule for Surgery Pt Education - [facility] Hernia Post-Op HCI (Chessica Audia): discussed with patient and provided information. Pt Education - [facility] Pain Control (Duaa Stelzner) The anatomy & physiology of the abdominal wall and pelvic floor was discussed. The pathophysiology of hernias in the inguinal and pelvic region was discussed. Natural history risks such as progressive enlargement, pain, incarceration, and strangulation was discussed. Contributors to complications such as smoking, obesity, diabetes, prior surgery, etc were discussed.  I feel the risks of no intervention will lead to serious problems that outweigh the operative risks; therefore, I recommended surgery to reduce and repair the hernia. I explained laparoscopic techniques with  possible need for an open approach. I noted usual use of mesh to patch and/or buttress hernia repair  Risks such as bleeding, infection, abscess, need for further treatment, heart attack, death, and other risks were discussed. I noted a good likelihood this will help address the problem. Goals of post-operative recovery were discussed as well. Possibility that this will not correct all symptoms was explained. I stressed the importance of low-impact activity, aggressive pain control, avoiding constipation, & not pushing through pain to minimize risk of post-operative chronic pain or injury. Possibility of reherniation was discussed. We will work to minimize complications.  An educational handout further explaining the pathology & treatment options was given as well. Questions were answered. The patient expresses understanding & wishes to proceed with surgery. PROLAPSED INTERNAL HEMORRHOIDS, GRADE 2 (455.2  K64.8) Impression: RIGHT posterior and LEFT lateral inflamed internal hemorrhoids. Rather sensitive. RIGHT anterior not so much. I offered banding. He was interested in proceeding. He tolerated banding rather well. Best band on LEFT lateral side. Still rather sensitive. Would hold off on doing [place] hemorrhoidal ligation unless fails banding 2 & is compliant on bowel regimen  Current Plans Pt Education - [facility] Hemorrhoids (Sam Wunschel) Pt Education - [place] (Shadrick Senne) Pt Education - [facility] Rectal Surgery HCI (Bandon Sherwin): discussed with patient and provided information. HEMORRHOIDECTOMY, INTERNAL, RUBBER BAND LIGATION (46221) TOBACCO ABUSE (305.1  Z72.0) Impression: I think many of his issues would be improved if he stop smoking. Recommend gastritis a persistent chronic problem. Also makes management of pain worse.  Current Plans STOP SMOKING!  We strongly recommend that you stop smoking. Smoking increases the risk of surgery including infection in the form of an open wound, pus formation, abscess,  hernia at an incision on the abdomen, etc. You have an increased risk of other MAJOR complications such as stroke, heart attack, forming clots in the leg and/or lungs, and death.  Smoking Cessation Quitting smoking is important to your health and has many advantages. However, it is not always easy to quit since nicotine is a very addictive drug. Often times, people try 3 times or more before being able to quit. This document explains the best ways for you to prepare to quit smoking. Quitting takes hard work and a lot of effort, but you can do it. ADVANTAGES OF QUITTING SMOKING  You will live longer, feel better, and live better.  Your body will feel the impact of quitting smoking almost immediately.  Within 20 minutes, blood pressure decreases. Your pulse returns to its normal level.  After 8 hours, carbon monoxide levels in the blood return to normal. Your oxygen level increases.  After  24 hours, the chance of having a heart attack starts to decrease. Your breath, hair, and body stop smelling like smoke.  After 48 hours, damaged nerve endings begin to recover. Your sense of taste and smell improve.  After 72 hours, the body is virtually free of nicotine. Your bronchial tubes relax and breathing becomes easier.  After 2 to 12 weeks, lungs can hold more air. Exercise becomes easier and circulation improves.  The risk of having a heart attack, stroke, cancer, or lung disease is greatly reduced.  After 1 year, the risk of coronary heart disease is cut in half.  After 5 years, the risk of stroke falls to the same as a nonsmoker.  After 10 years, the risk of lung cancer is cut in half and the risk of other cancers decreases significantly.  After 15 years, the risk of coronary heart disease drops, usually to the level of a nonsmoker.  If you are pregnant, quitting smoking will improve your chances of having a healthy baby.  The people you live with, especially any children, will be  healthier.  You will have extra money to spend on things other than cigarettes. QUESTIONS TO THINK ABOUT BEFORE ATTEMPTING TO QUIT You may want to talk about your answers with your caregiver.  Why do you want to quit?  If you tried to quit in the past, what helped and what did not?  What will be the most difficult situations for you after you quit? How will you plan to handle them?  Who can help you through the tough times? Your family? Friends? A caregiver?  What pleasures do you get from smoking? What ways can you still get pleasure if you quit? Here are some questions to ask your caregiver:  How can you help me to be successful at quitting?  What medicine do you think would be best for me and how should I take it?  What should I do if I need more help?  What is smoking withdrawal like? How can I get information on withdrawal? GET READY  Set a quit date.  Change your environment by getting rid of all cigarettes, ashtrays, matches, and lighters in your home, car, or work. Do not let people smoke in your home.  Review your past attempts to quit. Think about what worked and what did not. GET SUPPORT AND ENCOURAGEMENT You have a better chance of being successful if you have help. You can get support in many ways.  Tell your family, friends, and co-workers that you are going to quit and need their support. Ask them not to smoke around you.  Get individual, group, or telephone counseling and support. Programs are available at [employer] and health centers. Call your local health department for information about programs in your area.  Spiritual beliefs and practices may help some smokers quit.  Download a "quit meter" on your computer to keep track of quit statistics, such as how long you have gone without smoking, cigarettes not smoked, and money saved.  Get a self-help book about quitting smoking and staying off of tobacco. [place]  yourself from urges to smoke. Talk to someone, go for a walk, or occupy your time with a task.  Change your normal routine. Take a different route to work. Drink tea instead of coffee. Eat breakfast in a different place.  Reduce your stress. Take a hot bath, exercise, or read a book.  Plan something enjoyable to do every  day. Reward yourself for not smoking.  Explore interactive web-based programs that specialize in helping you quit. GET MEDICINE AND USE IT CORRECTLY Medicines can help you stop smoking and decrease the urge to smoke. Combining medicine with the above behavioral methods and support can greatly increase your chances of successfully quitting smoking.  Nicotine replacement therapy helps deliver nicotine to your body without the negative effects and risks of smoking. Nicotine replacement therapy includes nicotine gum, lozenges, inhalers, nasal sprays, and skin patches. Some may be available over-the-counter and others require a prescription.  Antidepressant medicine helps people abstain from smoking, but how this works is unknown. This medicine is available by prescription.  Nicotinic receptor partial agonist medicine simulates the effect of nicotine in your brain. This medicine is available by prescription. Ask your caregiver for advice about which medicines to use and how to use them based on your health history. Your caregiver will tell you what side effects to look out for if you choose to be on a medicine or therapy. Carefully read the information on the package. Do not use any other product containing nicotine while using a nicotine replacement product. RELAPSE OR DIFFICULT SITUATIONS Most relapses occur within the first 3 months after quitting. Do not be discouraged if you start smoking again. Remember, most people try several times before finally quitting. You may have symptoms of withdrawal because your body is used to nicotine. You may crave cigarettes, be irritable, feel  very hungry, cough often, get headaches, or have difficulty concentrating. The withdrawal symptoms are only temporary. They are strongest when you first quit, but they will go away within 10 14 days. To reduce the chances of relapse, try to:  Avoid drinking alcohol. Drinking lowers your chances of successfully quitting.  Reduce the amount of caffeine you consume. Once you quit smoking, the amount of caffeine in your body increases and can give you symptoms, such as a rapid heartbeat, sweating, and anxiety.  Avoid smokers because they can make you want to smoke.  Do not let weight gain distract you. Many smokers will gain weight when they quit, usually less than 10 pounds. Eat a healthy diet and stay active. You can always lose the weight gained after you quit.  Find ways to improve your mood other than smoking. FOR MORE INFORMATION www.smokefree.gov   While it can be one of the most difficult things to do, the [facility] has programs to help you stop. Consider talking with your primary care physician about options. Also, Smoking Cessation classes are available through the [facility]:  The smoking cessation program is a proven-effective program from the American Lung Association. The program is available for anyone 30 and older who currently smokes. The program lasts for 7 weeks and is 8 sessions. Each class will be approximately 1 1/2 hours. The program is every Tuesday. All classes are 12-1:30pm and same location.  Event Location Information: Location: [place] 2nd Floor Conference Room 2-037; located next to [facility] cross streets: [place] Entrance into the [facility] is adjacent to the [employer] main entrance. The conference room is located on the 2nd floor. Parking Instructions: Visitor parking is adjacent to [employer] main entrance and the [place]   A smoking  cessation program is also offered through the [facility]. Register online at ClickDebate.gl or call [phone] for more information. Tobacco cessation counseling is available at [facility]. Call [phone] for a free appointment. Tobacco cessation classes also are available through the [facility] in [place]. For information, call [phone]. The Patient Education Network features videos on tobacco cessation. Please consult your listings in the center of this book to find instructions on how to access this resource. If you want more information, ask your nurse.   Brandon Ali, M.D., F.A.C.S. Gastrointestinal and Minimally Invasive Surgery [facility] [address] [place], [place] [address] [phone] Main / Paging

## 2014-08-20 ENCOUNTER — Encounter (HOSPITAL_COMMUNITY): Payer: Self-pay

## 2014-08-20 ENCOUNTER — Encounter (HOSPITAL_COMMUNITY)
Admission: RE | Admit: 2014-08-20 | Discharge: 2014-08-20 | Disposition: A | Discharge status: Home / Self Care | Payer: No Typology Code available for payment source | Source: Ambulatory Visit | Attending: Surgery | Admitting: Surgery

## 2014-08-20 DIAGNOSIS — Z01812 Encounter for preprocedural laboratory examination: Secondary | ICD-10-CM | POA: Diagnosis present

## 2014-08-20 HISTORY — DX: Anxiety disorder, unspecified: F41.9

## 2014-08-20 HISTORY — DX: Headache: R51

## 2014-08-20 HISTORY — DX: Other chronic pain: G89.29

## 2014-08-20 HISTORY — DX: Gastro-esophageal reflux disease without esophagitis: K21.9

## 2014-08-20 HISTORY — DX: Vitamin D deficiency, unspecified: E55.9

## 2014-08-20 HISTORY — DX: Depression, unspecified: F32.A

## 2014-08-20 HISTORY — DX: Gastric ulcer, unspecified as acute or chronic, without hemorrhage or perforation: K25.9

## 2014-08-20 HISTORY — DX: Major depressive disorder, single episode, unspecified: F32.9

## 2014-08-20 HISTORY — DX: Headache, unspecified: R51.9

## 2014-08-20 HISTORY — DX: Dorsalgia, unspecified: M54.9

## 2014-08-20 LAB — CBC
HEMATOCRIT: 45.9 % (ref 39.0–52.0)
Hemoglobin: 15.5 g/dL (ref 13.0–17.0)
MCH: 25.5 pg — ABNORMAL LOW (ref 26.0–34.0)
MCHC: 33.8 g/dL (ref 30.0–36.0)
MCV: 75.4 fL — AB (ref 78.0–100.0)
Platelets: 224 10*3/uL (ref 150–400)
RBC: 6.09 MIL/uL — AB (ref 4.22–5.81)
RDW: 14.6 % (ref 11.5–15.5)
WBC: 4.5 10*3/uL (ref 4.0–10.5)

## 2014-08-20 LAB — COMPREHENSIVE METABOLIC PANEL
ALK PHOS: 52 U/L (ref 39–117)
ALT: 10 U/L (ref 0–53)
AST: 16 U/L (ref 0–37)
Albumin: 4 g/dL (ref 3.5–5.2)
Anion gap: 7 (ref 5–15)
BUN: 10 mg/dL (ref 6–23)
CHLORIDE: 103 mmol/L (ref 96–112)
CO2: 28 mmol/L (ref 19–32)
CREATININE: 1.04 mg/dL (ref 0.50–1.35)
Calcium: 9.2 mg/dL (ref 8.4–10.5)
GFR, EST NON AFRICAN AMERICAN: 84 mL/min — AB (ref >90)
Glucose, Bld: 100 mg/dL — ABNORMAL HIGH (ref 70–99)
Potassium: 4.2 mmol/L (ref 3.5–5.1)
Sodium: 138 mmol/L (ref 135–145)
TOTAL PROTEIN: 6.7 g/dL (ref 6.0–8.3)
Total Bilirubin: 0.5 mg/dL (ref 0.3–1.2)

## 2014-08-20 NOTE — Progress Notes (Signed)
Pt denies SOB, chest pain, and being under the care of a cardiologist. Pt denies having a chest x ray and EKG within the last year. Pt denies having a stress test, echo and cardiac cath.

## 2014-08-20 NOTE — Pre-Procedure Instructions (Signed)
Brandon Ali  08/20/2014   Your procedure is scheduled on: Thursday, August 29, 2014  Report to [facility] Admitting at 5:30 AM.  Call this number if you have problems the morning of surgery: [phone]   Remember:   Do not eat food or drink liquids after midnight Wednesday, August 28, 2014   Take these medicines the morning of surgery with A SIP OF WATER: ALPRAZolam Duanne Moron), omeprazole (PRILOSEC), oxyCODONE (ROXICODONE), if needed: famotidine (PEPCID) for heartburn or indigestion.  Stop taking Aspirin, vitamins and herbal medications. Do not take any NSAIDs ie: Ibuprofen, Advil, Naproxen or any medication containing Aspirin;stop 1 week prior to procedure (Thursday, August 22, 2014 ).   Do not wear jewelry, make-up or nail polish.  Do not wear lotions, powders, or perfumes. You may not wear deodorant.  Do not shave 48 hours prior to surgery. Men may shave face and neck.  Do not bring valuables to the hospital.  [facility] is not responsible for any belongings or valuables.               Contacts, dentures or bridgework may not be worn into surgery.  Leave suitcase in the car. After surgery it may be brought to your room.  For patients admitted to the hospital, discharge time is determined by your treatment team.               Patients discharged the day of surgery will not be allowed to drive home.  Name and phone number of your driver:   Special Instructions:  Special Instructions:Special Instructions: [facility] - Preparing for Surgery  Before surgery, you can play an important role.  Because skin is not sterile, your skin needs to be as free of germs as possible.  You can reduce the number of germs on you skin by washing with CHG (chlorahexidine gluconate) soap before surgery.  CHG is an antiseptic cleaner which kills germs and bonds with the skin to continue killing germs even after washing.  Please DO NOT use if you have an allergy to CHG or antibacterial soaps.  If your  skin becomes reddened/irritated stop using the CHG and inform your nurse when you arrive at Short Stay.  Do not shave (including legs and underarms) for at least 48 hours prior to the first CHG shower.  You may shave your face.  Please follow these instructions carefully:   1.  Shower with CHG Soap the night before surgery and the morning of Surgery.  2.  If you choose to wash your hair, wash your hair first as usual with your normal shampoo.  3.  After you shampoo, rinse your hair and body thoroughly to remove the Shampoo.  4.  Use CHG as you would any other liquid soap.  You can apply chg directly  to the skin and wash gently with scrungie or a clean washcloth.  5.  Apply the CHG Soap to your body ONLY FROM THE NECK DOWN.  Do not use on open wounds or open sores.  Avoid contact with your eyes, ears, mouth and genitals (private parts).  Wash genitals (private parts) with your normal soap.  6.  Wash thoroughly, paying special attention to the area where your surgery will be performed.  7.  Thoroughly rinse your body with warm water from the neck down.  8.  DO NOT shower/wash with your normal soap after using and rinsing off the CHG Soap.  9.  Pat yourself dry with a clean towel.  10.  Wear clean pajamas.            11.  Place clean sheets on your bed the night of your first shower and do not sleep with pets.  Day of Surgery  Do not apply any lotions/deodorants the morning of surgery.  Please wear clean clothes to the hospital/surgery center.   Please read over the following fact sheets that you were given: Pain Booklet, Coughing and Deep Breathing and Surgical Site Infection Prevention

## 2014-08-28 MED ORDER — CHLORHEXIDINE GLUCONATE 4 % EX LIQD
1.0000 "application " | Freq: Once | CUTANEOUS | Status: DC
  Filled 2014-08-28: qty 15

## 2014-08-28 MED ORDER — CEFAZOLIN SODIUM-DEXTROSE 2-3 GM-% IV SOLR
2.0000 g | INTRAVENOUS | Status: AC
  Administered 2014-08-29: 2 g via INTRAVENOUS
  Filled 2014-08-28: qty 50

## 2014-08-29 ENCOUNTER — Encounter (HOSPITAL_COMMUNITY)
Admission: RE | Disposition: A | Discharge status: Home / Self Care | Payer: Self-pay | Source: Ambulatory Visit | Attending: Surgery

## 2014-08-29 ENCOUNTER — Ambulatory Visit (HOSPITAL_COMMUNITY): Payer: No Typology Code available for payment source | Admitting: Anesthesiology

## 2014-08-29 ENCOUNTER — Ambulatory Visit (HOSPITAL_COMMUNITY)
Admission: RE | Admit: 2014-08-29 | Discharge: 2014-08-29 | Disposition: A | Discharge status: Home / Self Care | Payer: No Typology Code available for payment source | Source: Ambulatory Visit | Attending: Surgery | Admitting: Surgery

## 2014-08-29 ENCOUNTER — Encounter (HOSPITAL_COMMUNITY): Payer: Self-pay | Admitting: *Deleted

## 2014-08-29 DIAGNOSIS — Z8619 Personal history of other infectious and parasitic diseases: Secondary | ICD-10-CM | POA: Insufficient documentation

## 2014-08-29 DIAGNOSIS — K4091 Unilateral inguinal hernia, without obstruction or gangrene, recurrent: Secondary | ICD-10-CM | POA: Diagnosis not present

## 2014-08-29 DIAGNOSIS — I10 Essential (primary) hypertension: Secondary | ICD-10-CM | POA: Insufficient documentation

## 2014-08-29 DIAGNOSIS — F17210 Nicotine dependence, cigarettes, uncomplicated: Secondary | ICD-10-CM | POA: Insufficient documentation

## 2014-08-29 DIAGNOSIS — K4020 Bilateral inguinal hernia, without obstruction or gangrene, not specified as recurrent: Secondary | ICD-10-CM

## 2014-08-29 DIAGNOSIS — Z8711 Personal history of peptic ulcer disease: Secondary | ICD-10-CM | POA: Diagnosis not present

## 2014-08-29 DIAGNOSIS — F329 Major depressive disorder, single episode, unspecified: Secondary | ICD-10-CM | POA: Insufficient documentation

## 2014-08-29 DIAGNOSIS — Z888 Allergy status to other drugs, medicaments and biological substances status: Secondary | ICD-10-CM | POA: Insufficient documentation

## 2014-08-29 DIAGNOSIS — D176 Benign lipomatous neoplasm of spermatic cord: Secondary | ICD-10-CM | POA: Diagnosis not present

## 2014-08-29 DIAGNOSIS — Z886 Allergy status to analgesic agent status: Secondary | ICD-10-CM | POA: Diagnosis not present

## 2014-08-29 DIAGNOSIS — G43909 Migraine, unspecified, not intractable, without status migrainosus: Secondary | ICD-10-CM | POA: Insufficient documentation

## 2014-08-29 DIAGNOSIS — F419 Anxiety disorder, unspecified: Secondary | ICD-10-CM | POA: Insufficient documentation

## 2014-08-29 DIAGNOSIS — M1990 Unspecified osteoarthritis, unspecified site: Secondary | ICD-10-CM | POA: Insufficient documentation

## 2014-08-29 DIAGNOSIS — K4090 Unilateral inguinal hernia, without obstruction or gangrene, not specified as recurrent: Secondary | ICD-10-CM | POA: Diagnosis not present

## 2014-08-29 DIAGNOSIS — K219 Gastro-esophageal reflux disease without esophagitis: Secondary | ICD-10-CM | POA: Diagnosis not present

## 2014-08-29 HISTORY — PX: INGUINAL HERNIA REPAIR: SHX194

## 2014-08-29 SURGERY — REPAIR, HERNIA, INGUINAL, LAPAROSCOPIC
Anesthesia: General | Site: Groin | Laterality: Bilateral

## 2014-08-29 MED ORDER — LACTATED RINGERS IV SOLN
INTRAVENOUS | Status: DC | PRN
  Administered 2014-08-29 (×2): via INTRAVENOUS

## 2014-08-29 MED ORDER — SODIUM CHLORIDE 0.9 % IJ SOLN: 3.0000 mL | Freq: Two times a day (BID) | INTRAMUSCULAR | Status: DC

## 2014-08-29 MED ORDER — GLYCOPYRROLATE 0.2 MG/ML IJ SOLN
INTRAMUSCULAR | Status: DC | PRN
  Administered 2014-08-29: 0.6 mg via INTRAVENOUS

## 2014-08-29 MED ORDER — SODIUM CHLORIDE 0.9 % IJ SOLN
3.0000 mL | INTRAMUSCULAR | Status: DC | PRN
Start: 2014-08-29 — End: 2014-08-29

## 2014-08-29 MED ORDER — METHOCARBAMOL 500 MG PO TABS
1000.0000 mg | ORAL_TABLET | Freq: Four times a day (QID) | ORAL | Status: DC | PRN
  Filled 2014-08-29: qty 2

## 2014-08-29 MED ORDER — LIP MEDEX EX OINT: 1.0000 "application " | TOPICAL_OINTMENT | Freq: Two times a day (BID) | CUTANEOUS | Status: DC

## 2014-08-29 MED ORDER — DEXAMETHASONE SODIUM PHOSPHATE 4 MG/ML IJ SOLN
INTRAMUSCULAR | Status: DC | PRN
  Administered 2014-08-29: 4 mg via INTRAVENOUS

## 2014-08-29 MED ORDER — ONDANSETRON HCL 4 MG/2ML IJ SOLN
4.0000 mg | Freq: Four times a day (QID) | INTRAMUSCULAR | Status: DC | PRN
  Filled 2014-08-29: qty 2

## 2014-08-29 MED ORDER — NEOSTIGMINE METHYLSULFATE 10 MG/10ML IV SOLN
INTRAVENOUS | Status: DC | PRN
  Administered 2014-08-29: 4 mg via INTRAVENOUS

## 2014-08-29 MED ORDER — BUPIVACAINE-EPINEPHRINE (PF) 0.25% -1:200000 IJ SOLN
INTRAMUSCULAR | Status: AC
  Filled 2014-08-29: qty 30

## 2014-08-29 MED ORDER — PROPOFOL 10 MG/ML IV BOLUS
INTRAVENOUS | Status: DC | PRN
  Administered 2014-08-29: 140 mg via INTRAVENOUS

## 2014-08-29 MED ORDER — SODIUM CHLORIDE 0.9 % IR SOLN
Status: DC | PRN
  Administered 2014-08-29: 1000 mL

## 2014-08-29 MED ORDER — ACETAMINOPHEN 500 MG PO TABS
1000.0000 mg | ORAL_TABLET | Freq: Three times a day (TID) | ORAL | Status: DC
  Filled 2014-08-29 (×3): qty 2

## 2014-08-29 MED ORDER — FENTANYL CITRATE (PF) 100 MCG/2ML IJ SOLN
INTRAMUSCULAR | Status: DC | PRN
  Administered 2014-08-29: 50 ug via INTRAVENOUS
  Administered 2014-08-29: 25 ug via INTRAVENOUS
  Administered 2014-08-29 (×2): 100 ug via INTRAVENOUS
  Administered 2014-08-29: 50 ug via INTRAVENOUS
  Administered 2014-08-29: 25 ug via INTRAVENOUS
  Administered 2014-08-29 (×3): 50 ug via INTRAVENOUS

## 2014-08-29 MED ORDER — OXYCODONE HCL 5 MG PO TABS: 15.0000 mg | ORAL_TABLET | Freq: Four times a day (QID) | ORAL | Status: DC

## 2014-08-29 MED ORDER — FAMOTIDINE 20 MG PO TABS: 20.0000 mg | ORAL_TABLET | Freq: Two times a day (BID) | ORAL | Status: DC | PRN

## 2014-08-29 MED ORDER — MENTHOL 3 MG MT LOZG: 1.0000 | LOZENGE | OROMUCOSAL | Status: DC | PRN

## 2014-08-29 MED ORDER — PANTOPRAZOLE SODIUM 40 MG PO TBEC: 80.0000 mg | DELAYED_RELEASE_TABLET | Freq: Every day | ORAL | Status: DC

## 2014-08-29 MED ORDER — MAGIC MOUTHWASH
15.0000 mL | Freq: Four times a day (QID) | ORAL | Status: DC | PRN
Start: 2014-08-29 — End: 2014-08-29

## 2014-08-29 MED ORDER — PROPOFOL 10 MG/ML IV BOLUS
INTRAVENOUS | Status: AC
  Filled 2014-08-29: qty 20

## 2014-08-29 MED ORDER — 0.9 % SODIUM CHLORIDE (POUR BTL) OPTIME
TOPICAL | Status: DC | PRN
  Administered 2014-08-29 (×2): 1000 mL

## 2014-08-29 MED ORDER — ONDANSETRON HCL 4 MG/2ML IJ SOLN
INTRAMUSCULAR | Status: DC | PRN
  Administered 2014-08-29: 4 mg via INTRAVENOUS

## 2014-08-29 MED ORDER — HYDROMORPHONE HCL 1 MG/ML IJ SOLN
INTRAMUSCULAR | Status: AC
  Filled 2014-08-29: qty 1

## 2014-08-29 MED ORDER — ONDANSETRON 8 MG/NS 50 ML IVPB
8.0000 mg | Freq: Four times a day (QID) | INTRAVENOUS | Status: DC | PRN
  Filled 2014-08-29: qty 8

## 2014-08-29 MED ORDER — ROCURONIUM BROMIDE 100 MG/10ML IV SOLN
INTRAVENOUS | Status: DC | PRN
  Administered 2014-08-29: 50 mg via INTRAVENOUS

## 2014-08-29 MED ORDER — PROMETHAZINE HCL 25 MG/ML IJ SOLN: 6.2500 mg | INTRAMUSCULAR | Status: DC | PRN

## 2014-08-29 MED ORDER — HYDROMORPHONE HCL 1 MG/ML IJ SOLN
0.2500 mg | INTRAMUSCULAR | Status: DC | PRN
  Administered 2014-08-29 (×3): 0.5 mg via INTRAVENOUS

## 2014-08-29 MED ORDER — SODIUM CHLORIDE 0.9 % IJ SOLN: 3.0000 mL | INTRAMUSCULAR | Status: DC | PRN

## 2014-08-29 MED ORDER — LACTATED RINGERS IV BOLUS (SEPSIS): 1000.0000 mL | Freq: Three times a day (TID) | INTRAVENOUS | Status: DC | PRN

## 2014-08-29 MED ORDER — PHENOL 1.4 % MT LIQD: 2.0000 | OROMUCOSAL | Status: DC | PRN

## 2014-08-29 MED ORDER — VECURONIUM BROMIDE 10 MG IV SOLR
INTRAVENOUS | Status: DC | PRN
  Administered 2014-08-29: 1 mg via INTRAVENOUS

## 2014-08-29 MED ORDER — IBUPROFEN 600 MG PO TABS: 600.0000 mg | ORAL_TABLET | Freq: Four times a day (QID) | ORAL | Status: AC | PRN

## 2014-08-29 MED ORDER — HYDROMORPHONE HCL 1 MG/ML IJ SOLN
INTRAMUSCULAR | Status: DC
Start: 2014-08-29 — End: 2014-08-29
  Filled 2014-08-29: qty 1

## 2014-08-29 MED ORDER — LIDOCAINE HCL 4 % MT SOLN
OROMUCOSAL | Status: DC | PRN
  Administered 2014-08-29: 4 mL via TOPICAL

## 2014-08-29 MED ORDER — LORAZEPAM 2 MG/ML IJ SOLN: 0.5000 mg | Freq: Three times a day (TID) | INTRAMUSCULAR | Status: DC | PRN

## 2014-08-29 MED ORDER — MIDAZOLAM HCL 5 MG/5ML IJ SOLN
INTRAMUSCULAR | Status: DC | PRN
  Administered 2014-08-29: 2 mg via INTRAVENOUS

## 2014-08-29 MED ORDER — FENTANYL CITRATE (PF) 250 MCG/5ML IJ SOLN
INTRAMUSCULAR | Status: AC
  Filled 2014-08-29: qty 5

## 2014-08-29 MED ORDER — OXYCODONE HCL 5 MG PO TABS
ORAL_TABLET | ORAL | Status: AC
  Administered 2014-08-29: 15 mg
  Filled 2014-08-29: qty 3

## 2014-08-29 MED ORDER — SODIUM CHLORIDE 0.9 % IV SOLN: 250.0000 mL | INTRAVENOUS | Status: DC | PRN

## 2014-08-29 MED ORDER — ALPRAZOLAM 0.5 MG PO TABS: 0.5000 mg | ORAL_TABLET | Freq: Three times a day (TID) | ORAL | Status: DC

## 2014-08-29 MED ORDER — BUPIVACAINE-EPINEPHRINE 0.25% -1:200000 IJ SOLN
INTRAMUSCULAR | Status: DC | PRN
  Administered 2014-08-29: 90 mL

## 2014-08-29 MED ORDER — OXYCODONE HCL 15 MG PO TABS: 15.0000 mg | ORAL_TABLET | Freq: Four times a day (QID) | ORAL | Status: AC

## 2014-08-29 MED ORDER — IBUPROFEN 200 MG PO TABS: 200.0000 mg | ORAL_TABLET | Freq: Four times a day (QID) | ORAL | Status: DC | PRN

## 2014-08-29 MED ORDER — DIPHENHYDRAMINE HCL 50 MG/ML IJ SOLN: 12.5000 mg | Freq: Four times a day (QID) | INTRAMUSCULAR | Status: DC | PRN

## 2014-08-29 MED ORDER — SODIUM CHLORIDE 0.9 % IV SOLN
250.0000 mL | INTRAVENOUS | Status: DC | PRN
Start: 2014-08-29 — End: 2014-08-29

## 2014-08-29 MED ORDER — ALUM & MAG HYDROXIDE-SIMETH 200-200-20 MG/5ML PO SUSP: 30.0000 mL | Freq: Four times a day (QID) | ORAL | Status: DC | PRN

## 2014-08-29 MED ORDER — FENTANYL CITRATE (PF) 100 MCG/2ML IJ SOLN: 25.0000 ug | INTRAMUSCULAR | Status: DC | PRN

## 2014-08-29 MED ORDER — MIDAZOLAM HCL 2 MG/2ML IJ SOLN
INTRAMUSCULAR | Status: AC
  Filled 2014-08-29: qty 2

## 2014-08-29 MED ORDER — ARTIFICIAL TEARS OP OINT
TOPICAL_OINTMENT | OPHTHALMIC | Status: DC | PRN
  Administered 2014-08-29: 1 via OPHTHALMIC

## 2014-08-29 MED ORDER — LIDOCAINE HCL (CARDIAC) 20 MG/ML IV SOLN
INTRAVENOUS | Status: DC | PRN
  Administered 2014-08-29: 40 mg via INTRAVENOUS

## 2014-08-29 SURGICAL SUPPLY — 40 items
APPLIER CLIP 5 13 M/L LIGAMAX5 (MISCELLANEOUS)
BLADE SURG CLIPPER 3M 9600 (MISCELLANEOUS) ×3 IMPLANT
CHLORAPREP W/TINT 26ML (MISCELLANEOUS) ×3 IMPLANT
CLIP APPLIE 5 13 M/L LIGAMAX5 (MISCELLANEOUS) IMPLANT
COVER SURGICAL LIGHT HANDLE (MISCELLANEOUS) ×3 IMPLANT
DRAPE LAPAROSCOPIC ABDOMINAL (DRAPES) ×3 IMPLANT
DRAPE WARM FLUID 44X44 (DRAPE) ×3 IMPLANT
DRSG TEGADERM 2-3/8X2-3/4 SM (GAUZE/BANDAGES/DRESSINGS) ×3 IMPLANT
DRSG TEGADERM 4X4.75 (GAUZE/BANDAGES/DRESSINGS) ×3 IMPLANT
ELECT REM PT RETURN 9FT ADLT (ELECTROSURGICAL) ×3
ELECTRODE REM PT RTRN 9FT ADLT (ELECTROSURGICAL) ×1 IMPLANT
GAUZE SPONGE 2X2 8PLY STRL LF (GAUZE/BANDAGES/DRESSINGS) ×1 IMPLANT
GLOVE BIOGEL PI IND STRL 6.5 (GLOVE) ×1 IMPLANT
GLOVE BIOGEL PI IND STRL 8 (GLOVE) ×1 IMPLANT
GLOVE BIOGEL PI INDICATOR 6.5 (GLOVE) ×2
GLOVE BIOGEL PI INDICATOR 8 (GLOVE) ×2
GLOVE ECLIPSE 8.0 STRL XLNG CF (GLOVE) ×3 IMPLANT
GLOVE SURG SS PI 6.0 STRL IVOR (GLOVE) ×3 IMPLANT
GOWN STRL REUS W/ TWL LRG LVL3 (GOWN DISPOSABLE) ×3 IMPLANT
GOWN STRL REUS W/ TWL XL LVL3 (GOWN DISPOSABLE) ×1 IMPLANT
GOWN STRL REUS W/TWL LRG LVL3 (GOWN DISPOSABLE) ×6
GOWN STRL REUS W/TWL XL LVL3 (GOWN DISPOSABLE) ×2
KIT BASIN OR (CUSTOM PROCEDURE TRAY) ×3 IMPLANT
KIT ROOM TURNOVER OR (KITS) ×3 IMPLANT
MESH ULTRAPRO 6X6 15CM15CM (Mesh General) ×6 IMPLANT
NEEDLE 22X1 1/2 (OR ONLY) (NEEDLE) ×3 IMPLANT
NS IRRIG 1000ML POUR BTL (IV SOLUTION) ×3 IMPLANT
PAD ARMBOARD 7.5X6 YLW CONV (MISCELLANEOUS) ×6 IMPLANT
SCISSORS LAP 5X35 DISP (ENDOMECHANICALS) ×3 IMPLANT
SET IRRIG TUBING LAPAROSCOPIC (IRRIGATION / IRRIGATOR) ×3 IMPLANT
SLEEVE ENDOPATH XCEL 5M (ENDOMECHANICALS) ×3 IMPLANT
SPONGE GAUZE 2X2 STER 10/PKG (GAUZE/BANDAGES/DRESSINGS) ×2
SUT MNCRL AB 4-0 PS2 18 (SUTURE) ×3 IMPLANT
TACKER 5MM HERNIA 3.5CML NAB (ENDOMECHANICALS) IMPLANT
TOWEL OR 17X24 6PK STRL BLUE (TOWEL DISPOSABLE) ×3 IMPLANT
TOWEL OR 17X26 10 PK STRL BLUE (TOWEL DISPOSABLE) ×3 IMPLANT
TRAY LAPAROSCOPIC (CUSTOM PROCEDURE TRAY) ×3 IMPLANT
TROCAR XCEL BLUNT TIP 100MML (ENDOMECHANICALS) ×3 IMPLANT
TROCAR XCEL NON-BLD 5MMX100MML (ENDOMECHANICALS) ×3 IMPLANT
TUBING INSUFFLATION (TUBING) ×3 IMPLANT

## 2014-08-29 NOTE — Progress Notes (Signed)
Called for sign out

## 2014-08-29 NOTE — Anesthesia Procedure Notes (Signed)
Procedure Name: Intubation Date/Time: 08/29/2014 7:43 AM Performed by: Suzy Bouchard Pre-anesthesia Checklist: Patient identified, Emergency Drugs available, Suction available, Patient being monitored and Timeout performed Patient Re-evaluated:Patient Re-evaluated prior to inductionOxygen Delivery Method: Circle system utilized Preoxygenation: Pre-oxygenation with 100% oxygen Intubation Type: IV induction Ventilation: Mask ventilation without difficulty Laryngoscope Size: Miller and 2 Grade View: Grade II Tube type: Oral Tube size: 7.5 mm Number of attempts: 1 Airway Equipment and Method: Stylet and LTA kit utilized Placement Confirmation: positive ETCO2,  breath sounds checked- equal and bilateral and ETT inserted through vocal cords under direct vision Secured at: 22 cm Tube secured with: Tape Dental Injury: Teeth and Oropharynx as per pre-operative assessment

## 2014-08-29 NOTE — Interval H&P Note (Signed)
History and Physical Interval Note:  08/29/2014 7:25 AM  Brandon Ali  has presented today for surgery, with the diagnosis of Bilateral Inguinal Hernia  The various methods of treatment have been discussed with the patient and family. After consideration of risks, benefits and other options for treatment, the patient has consented to  Procedure(s): LAPAROSCOPIC BILATERAL INGUINAL HERNIA REPAIRS (Bilateral) as a surgical intervention .  The patient's history has been reviewed, patient examined, no change in status, stable for surgery.  I have reviewed the patient's chart and labs.  Questions were answered to the patient's satisfaction.     Brandon Ali C.

## 2014-08-29 NOTE — Discharge Instructions (Signed)
HERNIA REPAIR: POST OP INSTRUCTIONS ° °1. DIET: Follow a light bland diet the first 24 hours after arrival home, such as soup, liquids, crackers, etc.  Be sure to include lots of fluids daily.  Avoid fast food or heavy meals as your are more likely to get nauseated.  Eat a low fat the next few days after surgery. °2. Take your usually prescribed home medications unless otherwise directed. °3. PAIN CONTROL: °a. Pain is best controlled by a usual combination of three different methods TOGETHER: °i. Ice/Heat °ii. Over the counter pain medication °iii. Prescription pain medication °b. Most patients will experience some swelling and bruising around the hernia(s) such as the bellybutton, groins, or old incisions.  Ice packs or heating pads (30-60 minutes up to 6 times a day) will help. Use ice for the first few days to help decrease swelling and bruising, then switch to heat to help relax tight/sore spots and speed recovery.  Some people prefer to use ice alone, heat alone, alternating between ice & heat.  Experiment to what works for you.  Swelling and bruising can take several weeks to resolve.   °c. It is helpful to take an over-the-counter pain medication regularly for the first few weeks.  Choose one of the following that works best for you: °i. Naproxen (Aleve, etc)  Two 220mg tabs twice a day °ii. Ibuprofen (Advil, etc) Three 200mg tabs four times a day (every meal & bedtime) °iii. Acetaminophen (Tylenol, etc) 325-650mg four times a day (every meal & bedtime) °d. A  prescription for pain medication should be given to you upon discharge.  Take your pain medication as prescribed.  °i. If you are having problems/concerns with the prescription medicine (does not control pain, nausea, vomiting, rash, itching, etc), please call us [phone] to see if we need to switch you to a different pain medicine that will work better for you and/or control your side effect better. °ii. If you need a refill on your pain  medication, please contact your pharmacy.  They will contact our office to request authorization. Prescriptions will not be filled after 5 pm or on week-ends. °4. Avoid getting constipated.  Between the surgery and the pain medications, it is common to experience some constipation.  Increasing fluid intake and taking a fiber supplement (such as Metamucil, Citrucel, FiberCon, MiraLax, etc) 1-2 times a day regularly will usually help prevent this problem from occurring.  A mild laxative (prune juice, Milk of Magnesia, MiraLax, etc) should be taken according to package directions if there are no bowel movements after 48 hours.   °5. Wash / shower every day.  You may shower over the dressings as they are waterproof.   °6. Remove your waterproof bandages 5 days after surgery.  You may leave the incision open to air.  You may replace a dressing/Band-Aid to cover the incision for comfort if you wish.  Continue to shower over incision(s) after the dressing is off. ° ° ° °7. ACTIVITIES as tolerated:   °a. You may resume regular (light) daily activities beginning the next day--such as daily self-care, walking, climbing stairs--gradually increasing activities as tolerated.  If you can walk 30 minutes without difficulty, it is safe to try more intense activity such as jogging, treadmill, bicycling, low-impact aerobics, swimming, etc. °b. Save the most intensive and strenuous activity for last such as sit-ups, heavy lifting, contact sports, etc  Refrain from any heavy lifting or straining until you are off narcotics for pain control.   °  c. DO NOT PUSH THROUGH PAIN.  Let pain be your guide: If it hurts to do something, don't do it.  Pain is your body warning you to avoid that activity for another week until the pain goes down. °d. You may drive when you are no longer taking prescription pain medication, you can comfortably wear a seatbelt, and you can safely maneuver your car and apply brakes. °e. You may have sexual intercourse  when it is comfortable.  °8. FOLLOW UP in our office °a. Please call [facility] at [phone] to set up an appointment to see your surgeon in the office for a follow-up appointment approximately 2-3 weeks after your surgery. °b. Make sure that you call for this appointment the day you arrive home to insure a convenient appointment time. °9.  IF YOU HAVE DISABILITY OR FAMILY LEAVE FORMS, BRING THEM TO THE OFFICE FOR PROCESSING.  DO NOT GIVE THEM TO YOUR DOCTOR. ° °WHEN TO CALL US [phone]: °1. Poor pain control °2. Reactions / problems with new medications (rash/itching, nausea, etc)  °3. Fever over 101.5 F (38.5 C) °4. Inability to urinate °5. Nausea and/or vomiting °6. Worsening swelling or bruising °7. Continued bleeding from incision. °8. Increased pain, redness, or drainage from the incision ° ° The clinic staff is available to answer your questions during regular business hours (8:30am-5pm).  Please don’t hesitate to call and ask to speak to one of our nurses for clinical concerns.  ° If you have a medical emergency, go to the nearest emergency room or call 911. ° A surgeon from [facility] is always on call at the hospitals in [place] ° °[facility] °[address], [address], [place], [place]  [address] ? ° [address] [address], [place], [place]   [address] °MAIN: [phone] ? TOLL FREE: [phone] ? FAX: [phone] °[email or web address] ° °Managing Pain ° °Pain after surgery or related to activity is often due to strain/injury to muscle, tendon, nerves and/or incisions.  This pain is usually short-term and will improve in a few months.  ° °Many people find it helpful to do the following things TOGETHER to help speed the process of healing and to get back to regular activity more quickly: ° °1. Avoid heavy physical activity at first °a. No lifting greater than 20 pounds at first, then increase to lifting as tolerated over the next few weeks °b. Do not “push  through” the pain.  Listen to your body and avoid positions and maneuvers than reproduce the pain.  Wait a few days before trying something more intense °c. Walking is okay as tolerated, but go slowly and stop when getting sore.  If you can walk 30 minutes without stopping or pain, you can try more intense activity (running, jogging, aerobics, cycling, swimming, treadmill, sex, sports, weightlifting, etc ) °d. Remember: If it hurts to do it, then don’t do it! ° °2. Take Anti-inflammatory medication °a. Choose ONE of the following over-the-counter medications: °i.            Acetaminophen 500mg tabs (Tylenol) 1-2 pills with every meal and just before bedtime (avoid if you have liver problems) °ii.            Naproxen 220mg tabs (ex. Aleve) 1-2 pills twice a day (avoid if you have kidney, stomach, IBD, or bleeding problems) °iii. Ibuprofen 200mg tabs (ex. Advil, Motrin) 3-4 pills with every meal and just before bedtime (avoid if you have kidney, stomach, IBD, or bleeding   problems) b. Take with food/snack around the clock for 1-2 weeks i. This helps the muscle and nerve tissues become less irritable and calm down faster  3. Use a Heating pad or Ice/Cold Pack a. 4-6 times a day b. May use warm bath/hottub  or showers  4. Try Gentle Massage and/or Stretching  a. at the area of pain many times a day b. stop if you feel pain - do not overdo it  Try these steps together to help you body heal faster and avoid making things get worse.  Doing just one of these things may not be enough.    If you are not getting better after two weeks or are noticing you are getting worse, contact our office for further advice; we may need to re-evaluate you & see what other things we can do to help.  GETTING TO GOOD BOWEL HEALTH. Irregular bowel habits such as constipation and diarrhea can lead to many problems over time.  Having one soft bowel movement a day is the most important way to prevent further problems.  The  anorectal canal is designed to handle stretching and feces to safely manage our ability to get rid of solid waste (feces, poop, stool) out of our body.  BUT, hard constipated stools can act like ripping concrete bricks and diarrhea can be a burning fire to this very sensitive area of our body, causing inflamed hemorrhoids, anal fissures, increasing risk is perirectal abscesses, abdominal pain/bloating, an making irritable bowel worse.     The goal: ONE SOFT BOWEL MOVEMENT A DAY!  To have soft, regular bowel movements:   Drink at least 8 tall glasses of water a day.    Take plenty of fiber.  Fiber is the undigested part of plant food that passes into the colon, acting s natures broom to encourage bowel motility and movement.  Fiber can absorb and hold large amounts of water. This results in a larger, bulkier stool, which is soft and easier to pass. Work gradually over several weeks up to 6 servings a day of fiber (25g a day even more if needed) in the form of: o Vegetables -- Root (potatoes, carrots, turnips), leafy green (lettuce, salad greens, celery, spinach), or cooked high residue (cabbage, broccoli, etc) o Fruit -- Fresh (unpeeled skin & pulp), Dried (prunes, apricots, cherries, etc ),  or stewed ( applesauce)  o Whole grain breads, pasta, etc (whole wheat)  o Bran cereals   Bulking Agents -- This type of water-retaining fiber generally is easily obtained each day by one of the following:  o Psyllium bran -- The psyllium plant is remarkable because its ground seeds can retain so much water. This product is available as Metamucil, Konsyl, Effersyllium, Per Diem Fiber, or the less expensive generic preparation in drug and health food stores. Although labeled a laxative, it really is not a laxative.  o Methylcellulose -- This is another fiber derived from wood which also retains water. It is available as Citrucel. o Polyethylene Glycol - and artificial fiber commonly called Miralax or Glycolax.   It is helpful for people with gassy or bloated feelings with regular fiber o Flax Seed - a less gassy fiber than psyllium  No reading or other relaxing activity while on the toilet. If bowel movements take longer than 5 minutes, you are too constipated  AVOID CONSTIPATION.  High fiber and water intake usually takes care of this.  Sometimes a laxative is needed to stimulate more frequent bowel movements, but  Laxatives are not a good long-term solution as it can wear the colon out. o Osmotics (Milk of Magnesia, Fleets phosphosoda, Magnesium citrate, MiraLax, GoLytely) are safer than  o Stimulants (Senokot, Castor Oil, Dulcolax, Ex Lax)    o Do not take laxatives for more than 7days in a row.   IF SEVERELY CONSTIPATED, try a Bowel Retraining Program: o Do not use laxatives.  o Eat a diet high in roughage, such as bran cereals and leafy vegetables.  o Drink six (6) ounces of prune or apricot juice each morning.  o Eat two (2) large servings of stewed fruit each day.  o Take one (1) heaping tablespoon of a psyllium-based bulking agent twice a day. Use sugar-free sweetener when possible to avoid excessive calories.  o Eat a normal breakfast.  o Set aside 15 minutes after breakfast to sit on the toilet, but do not strain to have a bowel movement.  o If you do not have a bowel movement by the third day, use an enema and repeat the above steps.   Controlling diarrhea o Switch to liquids and simpler foods for a few days to avoid stressing your intestines further. o Avoid dairy products (especially milk & ice cream) for a short time.  The intestines often can lose the ability to digest lactose when stressed. o Avoid foods that cause gassiness or bloating.  Typical foods include beans and other legumes, cabbage, broccoli, and dairy foods.  Every person has some sensitivity to other foods, so listen to our body and avoid those foods that trigger problems for you. o Adding fiber (Citrucel, Metamucil,  psyllium, Miralax) gradually can help thicken stools by absorbing excess fluid and retrain the intestines to act more normally.  Slowly increase the dose over a few weeks.  Too much fiber too soon can backfire and cause cramping & bloating. o Probiotics (such as active yogurt, Align, etc) may help repopulate the intestines and colon with normal bacteria and calm down a sensitive digestive tract.  Most studies show it to be of mild help, though, and such products can be costly. o Medicines: - Bismuth subsalicylate (ex. Kayopectate, Pepto Bismol) every 30 minutes for up to 6 doses can help control diarrhea.  Avoid if pregnant. - Loperamide (Immodium) can slow down diarrhea.  Start with two tablets (4mg  total) first and then try one tablet every 6 hours.  Avoid if you are having fevers or severe pain.  If you are not better or start feeling worse, stop all medicines and call your doctor for advice o Call your doctor if you are getting worse or not better.  Sometimes further testing (cultures, endoscopy, X-ray studies, bloodwork, etc) may be needed to help diagnose and treat the cause of the diarrhea.  Inguinal Hernia, Adult Muscles help keep everything in the body in its proper place. But if a weak spot in the muscles develops, something can poke through. That is called a hernia. When this happens in the lower part of the belly (abdomen), it is called an inguinal hernia. (It takes its name from a part of the body in this region called the inguinal canal.) A weak spot in the wall of muscles lets some fat or part of the small intestine bulge through. An inguinal hernia can develop at any age. Men get them more often than women. CAUSES  In adults, an inguinal hernia develops over time. 1. It can be triggered by: 1. Suddenly straining the muscles of the lower abdomen. 2.  Lifting heavy objects. 3. Straining to have a bowel movement. Difficult bowel movements (constipation) can lead to this. 4. Constant  coughing. This may be caused by smoking or lung disease. 5. Being overweight. 34. Being pregnant. 7. Working at a job that requires long periods of standing or heavy lifting. 8. Having had an inguinal hernia before. One type can be an emergency situation. It is called a strangulated inguinal hernia. It develops if part of the small intestine slips through the weak spot and cannot get back into the abdomen. The blood supply can be cut off. If that happens, part of the intestine may die. This situation requires emergency surgery. SYMPTOMS  Often, a small inguinal hernia has no symptoms. It is found when a healthcare provider does a physical exam. Larger hernias usually have symptoms.   In adults, symptoms may include:  A lump in the groin. This is easier to see when the person is standing. It might disappear when lying down.  In men, a lump in the scrotum.  Pain or burning in the groin. This occurs especially when lifting, straining or coughing.  A dull ache or feeling of pressure in the groin.  Signs of a strangulated hernia can include:  A bulge in the groin that becomes very painful and tender to the touch.  A bulge that turns red or purple.  Fever, nausea and vomiting.  Inability to have a bowel movement or to pass gas. DIAGNOSIS  To decide if you have an inguinal hernia, a healthcare provider will probably do a physical examination.  This will include asking questions about any symptoms you have noticed.  The healthcare provider might feel the groin area and ask you to cough. If an inguinal hernia is felt, the healthcare provider may try to slide it back into the abdomen.  Usually no other tests are needed. TREATMENT  Treatments can vary. The size of the hernia makes a difference. Options include:  Watchful waiting. This is often suggested if the hernia is small and you have had no symptoms.  No medical procedure will be done unless symptoms develop.  You will need to  watch closely for symptoms. If any occur, contact your healthcare provider right away.  Surgery. This is used if the hernia is larger or you have symptoms.  Open surgery. This is usually an outpatient procedure (you will not stay overnight in a hospital). An cut (incision) is made through the skin in the groin. The hernia is put back inside the abdomen. The weak area in the muscles is then repaired by herniorrhaphy or hernioplasty. Herniorrhaphy: in this type of surgery, the weak muscles are sewn back together. Hernioplasty: a patch or mesh is used to close the weak area in the abdominal wall.  Laparoscopy. In this procedure, a surgeon makes small incisions. A thin tube with a tiny video camera (called a laparoscope) is put into the abdomen. The surgeon repairs the hernia with mesh by looking with the video camera and using two long instruments. HOME CARE INSTRUCTIONS   After surgery to repair an inguinal hernia:  You will need to take pain medicine prescribed by your healthcare provider. Follow all directions carefully.  You will need to take care of the wound from the incision.  Your activity will be restricted for awhile. This will probably include no heavy lifting for several weeks. You also should not do anything too active for a few weeks. When you can return to work will depend on the type  of job that you have.  During "watchful waiting" periods, you should:  Maintain a healthy weight.  Eat a diet high in fiber (fruits, vegetables and whole grains).  Drink plenty of fluids to avoid constipation. This means drinking enough water and other liquids to keep your urine clear or pale yellow.  Do not lift heavy objects.  Do not stand for long periods of time.  Quit smoking. This should keep you from developing a frequent cough. SEEK MEDICAL CARE IF:   A bulge develops in your groin area.  You feel pain, a burning sensation or pressure in the groin. This might be worse if you are  lifting or straining.  You develop a fever of more than 100.5 F (38.1 C). SEEK IMMEDIATE MEDICAL CARE IF:   Pain in the groin increases suddenly.  A bulge in the groin gets bigger suddenly and does not go down.  For men, there is sudden pain in the scrotum. Or, the size of the scrotum increases.  A bulge in the groin area becomes red or purple and is painful to touch.  You have nausea or vomiting that does not go away.  You feel your heart beating much faster than normal.  You cannot have a bowel movement or pass gas.  You develop a fever of more than 102.0 F (38.9 C). Document Released: 09/12/2008 Document Revised: 07/19/2011 Document Reviewed: 09/12/2008 [facility] Patient Information 2015 [place], [place]. This information is not intended to replace advice given to you by your health care provider. Make sure you discuss any questions you have with your health care provider.   HEMORRHOIDS  The rectum is the last foot of your colon, and it naturally stretches to hold stool.  Hemorrhoidal piles are natural clusters of blood vessels that help the rectum and anal canal stretch to hold stool and allow bowel movements to eliminate feces.   Hemorrhoids are abnormally swollen blood vessels in the rectum.  Too much pressure in the rectum causes hemorrhoids by forcing blood to stretch and bulge the walls of the veins, sometimes even rupturing them.  Hemorrhoids can become like varicose veins you might see on a person's legs.  Most people will develop a flare of hemorrhoids in their lifetime.  When bulging hemorrhoidal veins are irritated, they can swell, burn, itch, cause pain, and bleed.  Most flares will calm down gradually own within a few weeks.  However, once hemorrhoids are created, they are difficult to get rid of completely and tend to flare more easily than the first flare.   Fortunately, good habits and simple medical treatment usually control hemorrhoids well, and surgery is needed  only in severe cases. Types of Hemorrhoids:  Internal hemorrhoids usually don't initially hurt or itch; they are deep inside the rectum and usually have no sensation. If they begin to push out (prolapse), pain and burning can occur.  However, internal hemorrhoids can bleed.  Anal bleeding should not be ignored since bleeding could come from a dangerous source like colorectal cancer, so persistent rectal bleeding should be investigated by a doctor, sometimes with a colonoscopy.  External hemorrhoids cause most of the symptoms - pain, burning, and itching. Nonirritated hemorrhoids can look like small skin tags coming out of the anus.   Thrombosed hemorrhoids can form when a hemorrhoid blood vessel bursts and causes the hemorrhoid to suddenly swell.  A purple blood clot can form in it and become an excruciatingly painful lump at the anus. Because of these unpleasant symptoms, immediate  incision and drainage by a surgeon at an office visit can provide much relief of the pain.    PREVENTION Avoiding the most frequent causes listed below will prevent most cases of hemorrhoids: Constipation Hard stools Diarrhea  Constant sitting  Straining with bowel movements Sitting on the toilet for a long time  Severe coughing  episodes Pregnancy / Childbirth  Heavy Lifting  Sometimes avoiding the above triggers is difficult:  How can you avoid sitting all day if you have a seated job? Also, we try to avoid coughing and diarrhea, but sometimes its beyond your control.  Still, there are some practical hints to help: Keep the anal and genital area clean.  Moistened tissues such as flushable wet wipes are less irritating than toilet paper.  Using irrigating showers or bottle irrigation washing gently cleans this sensitive area.   Avoid dry toilet paper when cleaning after bowel movements.  Marland Kitchen Keep the anal and genital area dry.  Lightly pat the rectal area dry.  Avoid rubbing.  Talcum or baby powders can help GET YOUR  STOOLS SOFT.   This is the most important way to prevent irritated hemorrhoids.  Hard stools are like sandpaper to the anorectal canal and will cause more problems.  The goal: ONE SOFT BOWEL MOVEMENT A DAY!  BMs from every other day to 3 times a day is a tolerable range Treat coughing, diarrhea and constipation early since irritated hemorrhoids may soon follow.  If your main job activity is seated, always stand or walk during your breaks. Make it a point to stand and walk at least 5 minutes every hour and try to shift frequently in your chair to avoid direct rectal pressure.  Always exhale as you strain or lift. Don't hold your breath.  Do not delay or try to prevent a bowel movement when the urge is present. Exercise regularly (walking or jogging 60 minutes a day) to stimulate the bowels to move. No reading or other activity while on the toilet. If bowel movements take longer than 5 minutes, you are too constipated. AVOID CONSTIPATION Drink plenty of liquids (1 1/2 to 2 quarts of water and other fluids a day unless fluid restricted for another medical condition). Liquids that contain caffeine (coffee a, tea, soft drinks) can be dehydrating and should be avoided until constipation is controlled. Consider minimizing milk, as dairy products may be constipating. Eat plenty of fiber (30g a day ideal, more if needed).  Fiber is the undigested part of plant food that passes into the colon, acting as natures broom to encourage bowel motility and movement.  Fiber can absorb and hold large amounts of water. This results in a larger, bulkier stool, which is soft and easier to pass.  Eating foods high in fiber - 12 servings - such as  Vegetables: Root (potatoes, carrots, turnips), Leafy green (lettuce, salad greens, celery, spinach), High residue (cabbage, broccoli, etc.) Fruit: Fresh, Dried (prunes, apricots, cherries), Stewed (applesauce)  Whole grain breads, pasta, whole wheat Bran cereals, muffins,  etc. Consider adding supplemental bulking fiber which retains large volumes of water: Psyllium ground seeds (native plant from [place])--available as Metamucil, Konsyl, Effersyllium, Per Diem Fiber, or the less expensive generic forms.  Citrucel  (methylcellulose wood fiber) . FiberCon (Polycarbophil) Polyethylene Glycol - and artificial fiber commonly called Miralax or Glycolax.  It is helpful for people with gassy or bloated feelings with regular fiber Flax Seed - a less gassy natural fiber  Laxatives can be useful for a short  period if constipation is severe Osmotics (Milk of Magnesia, Fleets Phospho-Soda, Magnesium Citrate)  Stimulants (Senokot,   Castor Oil,  Dulcolax, Ex-Lax)    Laxatives are not a good long-term solution as it can stress the bowels and cause too much mineral loss and dehydration.   Avoid taking laxatives for more than 7 days in a row.  AVOID DIARRHEA Switch to liquids and simpler foods for a few days to avoid stressing your intestines further. Avoid dairy products (especially milk & ice cream) for a short time.  The intestines often can lose the ability to digest lactose when stressed. Avoid foods that cause gassiness or bloating.  Typical foods include beans and other legumes, cabbage, broccoli, and dairy foods.  Every person has some sensitivity to other foods, so listen to your body and avoid those foods that trigger problems for you. Adding fiber (Citrucel, Metamucil, FiberCon, Flax seed, Miralax) gradually can help thicken stools by absorbing excess fluid and retrain the intestines to act more normally.  Slowly increase the dose over a few weeks.  Too much fiber too soon can backfire and cause cramping & bloating. Probiotics (such as active yogurt, Align, etc) may help repopulate the intestines and colon with normal bacteria and calm down a sensitive digestive tract.  Most studies show it to be of mild help, though, and such products can be  costly. Medicines: Bismuth subsalicylate (ex. Kayopectate, Pepto Bismol) every 30 minutes for up to 6 doses can help control diarrhea.  Avoid if pregnant. Loperamide (Immodium) can slow down diarrhea.  Start with two tablets (4mg  total) first and then try one tablet every 6 hours.  Avoid if you are having fevers or severe pain.  If you are not better or start feeling worse, stop all medicines and call your doctor for advice Call your doctor if you are getting worse or not better.  Sometimes further testing (cultures, endoscopy, X-ray studies, bloodwork, etc) may be needed to help diagnose and treat the cause of the diarrhea. TREATMENT OF HEMORRHOID FLARE If these preventive measures fail, you must take action right away! Hemorrhoids are one condition that can be mild in the morning and become intolerable by nightfall. Most hemorrhoidal flares take several weeks to calm down.  These suggestions can help: Warm soaks.  This helps more than any topical medication.  Use up to 8 times a day.  Usually sitz baths or sitting in a warm bathtub helps.  Sitting on moist warm towels are helpful.  Switching to ice packs/cool compresses can be helpful  Use a Sitz Bath 4-8 times a day for relief A sitz bath is a warm water bath taken in the sitting position that covers only the hips and buttocks. It may be used for either healing or hygiene purposes. Sitz baths are also used to relieve pain, itching, or muscle spasms. The water may contain medicine. Moist heat will help you heal and relax.  HOME CARE INSTRUCTIONS  Take 3 to 4 sitz baths a day. 2. Fill the bathtub half full with warm water. 3. Sit in the water and open the drain a little. 4. Turn on the warm water to keep the tub half full. Keep the water running constantly. 5. Soak in the water for 15 to 20 minutes. 6. After the sitz bath, pat the affected area dry first. SEEK MEDICAL CARE IF:  You get worse instead of better. Stop the sitz baths if you get  worse.  Normalize your bowels.  Extremes of diarrhea  or constipation will make hemorrhoids worse.  One soft bowel movement a day is the goal.  Fiber can help get your bowels regular Wet wipes instead of toilet paper Pain control with a NSAID such as ibuprofen (Advil) or naproxen (Aleve) or acetaminophen (Tylenol) around the clock.  Narcotics are constipating and should be minimized if possible Topical creams contain steroids (bydrocortisone) or local anesthetic (xylocaine) can help make pain and itching more tolerable.   EVALUATION If hemorrhoids are still causing problems, you could benefit by an evaluation by a surgeon.  The surgeon will obtain a history and examine you.  If hemorrhoids are diagnosed, some therapies can be offered in the office, usually with an anoscope into the less sensitive area of the rectum: -injection of hemorrhoids (sclerotherapy) can scar the blood vessels of the swollen/enlarged hemorrhoids to help shrink them down to a more normal size -rubber banding of the enlarged hemorrhoids to help shrink them down to a more normal size -drainage of the blood clot causing a thrombosed hemorrhoid,  to relieve the severe pain   While 90% of the time such problems from hemorrhoids can be managed without preceding to surgery, sometimes the hemorrhoids require a operation to control the problem (uncontrolled bleeding, prolapse, pain, etc.).   This involves being placed under general anesthesia where the surgeon can confirm the diagnosis and remove, suture, or staple the hemorrhoid(s).  Your surgeon can help you treat the problem appropriately.

## 2014-08-29 NOTE — H&P (Signed)
Brandon Ali  Location: [facility] Patient #: [id number] DOB: 04/18/1966 Divorced / Language: [name] / Race: Undefined Male  History of Present Illness   The patient is a 49 year old male who presents with hemorrhoids. Patient sent by nurse practitioner [name] at [facility]. Patient complains of groin pain and hemorrhoids. Pleasant smoking male. Originally from [place]. Struggles with intermittent constipation and bloating. Has used fiber supplements intermittently. Has struggled with anal pain consistent with hemorrhoids for many years. His never had to have any interventions. Has tried creams and suppositories. Feels like it is a worsening problem. Wished surgical evaluation. Occasionally has bowel movements every day but sometimes can be a few days. History of gastritis. Helicobacter pylori. Has been on rounds of antibiotics. That was years ago. No new major issues. On proton pump inhibitor. Follow-up by Dr. [name] with [facility] gastroenterology in the past. History of hepatitis C but stable. Patient also notes he has pain in his LEFT testicle. Think he's had a history of urinary tract infections in the past. Not recently. The struggle with some occasional bloating and discomfort. Never had a colonoscopy. No history of Crohn's or ulcerative colitis that he is aware of. No family history of this either. He smokes about a pack today. No history of heart attacks or strokes. Can walk about 20 or 30 minutes without difficulty.   Other Problems [name], MA; 07/08/2014 8:47 AM) Anxiety Disorder Arthritis Back Pain Chest pain Depression Gastroesophageal Reflux Disease Hemorrhoids Hepatitis High blood pressure Migraine Headache  Past Surgical History [name], MA; 07/08/2014 8:47 AM) Laparoscopic Inguinal Hernia Surgery Left. Oral Surgery Spinal Surgery - Neck  Diagnostic Studies History C[facility]  [place], [name]; 07/08/2014 8:47 AM) Colonoscopy never  Allergies [name], MA; 07/08/2014 8:49 AM) Aspirin *ANALGESICS - NonNarcotic*  Medication History ([name], MA; 07/08/2014 8:50 AM) Xanax (0.5MG  Tablet, Oral) Active. Pepcid (20MG  Tablet, Oral) Active. PriLOSEC (40MG  Capsule DR, Oral) Active. OxyCODONE HCl (15MG  Tablet, Oral) Active. Medications Reconciled  Social History [facility], [name]; 07/08/2014 8:47 AM) Caffeine use Coffee, Tea. No alcohol use No drug use Tobacco use Current every day smoker.  Family History [place], [name]; 07/08/2014 8:47 AM) Arthritis Mother. Cervical Cancer Sister. Hypertension Mother. Ischemic Bowel Disease Sister. Kidney Disease Brother.  Review of Systems (Tiedt George MA; 07/08/2014 8:47 AM) General Present- Appetite Loss, Chills, Fatigue, Fever and Weight Loss. Not Present- Night Sweats and Weight Gain. Skin Present- Hives and Rash. Not Present- Change in Wart/Mole, Dryness, Jaundice, New Lesions, Non-Healing Wounds and Ulcer. HEENT Present- Oral Ulcers, Seasonal Allergies, Sore Throat and Visual Disturbances. Not Present- Earache, Hearing Loss, Hoarseness, Nose Bleed, Ringing in the Ears, Sinus Pain, Wears glasses/contact lenses and Yellow Eyes. Respiratory Not Present- Bloody sputum, Chronic Cough, Difficulty Breathing, Snoring and Wheezing. Breast Not Present- Breast Mass, Breast Pain, Nipple Discharge and Skin Changes. Cardiovascular Present- Chest Pain, Leg Cramps and Rapid Heart Rate. Not Present- Difficulty Breathing Lying Down, Palpitations, Shortness of Breath and Swelling of Extremities. Gastrointestinal Present- Abdominal Pain, Bloating, Change in Bowel Habits, Constipation, Excessive gas, Hemorrhoids, Indigestion, Nausea and Vomiting. Not Present- Bloody Stool, Chronic diarrhea, Difficulty Swallowing, Gets full quickly at meals and Rectal Pain. Male Genitourinary Present- Frequency and Urgency.  Not Present- Nocturia, Painful Urination and Pelvic Pain. Musculoskeletal Present- Back Pain, Joint Pain, Muscle Pain and Muscle Weakness. Not Present- Joint Stiffness and Swelling of Extremities. Neurological Present- Decreased Memory, Headaches and Weakness. Not Present- Fainting, Numbness, Seizures, Tingling, Tremor and Trouble walking.  Psychiatric Present- Anxiety, Change in Sleep Pattern and Depression. Not Present- Bipolar, Fearful and Frequent crying.   Vitals ([name] MA; 07/08/2014 8:48 AM) 07/08/2014 8:48 AM Weight: 174 lb Height: 68in Body Surface Area: 1.95 m Body Mass Index: 26.46 kg/m Pulse: 72 (Regular)  BP: 132/82 (Sitting, Left Arm, Standard)    Physical Exam [name] MD; 07/08/2014 9:48 AM) General Mental Status-Alert. General Appearance-Not in acute distress, Not Sickly. Orientation-Oriented X3. Hydration-Well hydrated. Voice-Normal.  Integumentary Global Assessment Upon inspection and palpation of skin surfaces of the - Axillae: non-tender, no inflammation or ulceration, no drainage. and Distribution of scalp and body hair is normal. General Characteristics Temperature - normal warmth is noted.  Head and Neck Head-normocephalic, atraumatic with no lesions or palpable masses. Face Global Assessment - atraumatic, no absence of expression. Neck Global Assessment - no abnormal movements, no bruit auscultated on the right, no bruit auscultated on the left, no decreased range of motion, non-tender. Trachea-midline. Thyroid Gland Characteristics - non-tender.  Eye Eyeball - Left-Extraocular movements intact, No Nystagmus. Eyeball - Right-Extraocular movements intact, No Nystagmus. Cornea - Left-No Hazy. Cornea - Right-No Hazy. Sclera/Conjunctiva - Left-No scleral icterus, No Discharge. Sclera/Conjunctiva - Right-No scleral icterus, No Discharge. Pupil - Left-Direct reaction to light normal. Pupil -  Right-Direct reaction to light normal.  ENMT Ears Pinna - Left - no drainage observed, no generalized tenderness observed. Right - no drainage observed, no generalized tenderness observed. Nose and Sinuses External Inspection of the Nose - no destructive lesion observed. Inspection of the nares - Left - quiet respiration. Right - quiet respiration. Mouth and Throat Lips - Upper Lip - no fissures observed, no pallor noted. Lower Lip - no fissures observed, no pallor noted. Nasopharynx - no discharge present. Oral Cavity/Oropharynx - Tongue - no dryness observed. Oral Mucosa - no cyanosis observed. Hypopharynx - no evidence of airway distress observed.  Chest and Lung Exam Inspection Movements - Normal and Symmetrical. Accessory muscles - No use of accessory muscles in breathing. Palpation Palpation of the chest reveals - Non-tender. Auscultation Breath sounds - Normal and Clear.  Cardiovascular Auscultation Rhythm - Regular. Murmurs & Other Heart Sounds - Auscultation of the heart reveals - No Murmurs and No Systolic Clicks.  Abdomen Inspection Inspection of the abdomen reveals - No Visible peristalsis and No Abnormal pulsations. Umbilicus - No Bleeding, No Urine drainage. Palpation/Percussion Palpation and Percussion of the abdomen reveal - Soft, Non Tender, No Rebound tenderness, No Rigidity (guarding) and No Cutaneous hyperesthesia. Note: Mildly distended but soft. Mild epigastric discomfort. Not severe. No diastases. No umbilical hernia.   Male Genitourinary Note: Circumcised male. Spermatic cords normal. RIGHT testes normal. Obviously enlarged LEFT epididymis. Rather tender. Testicle on LEFT side without any masses.  Obvious indirect inguinal hernia on LEFT side. RIGHT noticeable with Valsalva as well.   Rectal Note: Rather hairy. No pilonidal disease. No pruritus. Normal sphincter tone. No external hemorrhoids. No condyloma. No fissure. No fistula.  Inflamed  internal hemorrhoids. Some mild bleeding. RIGHT posterior and LEFT lateral. RIGHT anterior milder   Peripheral Vascular Upper Extremity Inspection - Left - No Cyanotic nailbeds, Not Ischemic. Right - No Cyanotic nailbeds, Not Ischemic.  Neurologic Neurologic evaluation reveals -normal attention span and ability to concentrate, able to name objects and repeat phrases. Appropriate fund of knowledge , normal sensation and normal coordination. Mental Status Affect - not angry, not paranoid. Cranial Nerves-Normal Bilaterally. Gait-Normal.  Neuropsychiatric Mental status exam performed with findings of-able to articulate well with normal speech/language,  rate, volume and coherence, thought content normal with ability to perform basic computations and apply abstract reasoning and no evidence of hallucinations, delusions, obsessions or homicidal/suicidal ideation.  Musculoskeletal Global Assessment Spine, Ribs and Pelvis - no instability, subluxation or laxity. Right Upper Extremity - no instability, subluxation or laxity.  Lymphatic Head & Neck  General Head & Neck Lymphatics: Bilateral - Description - No Localized lymphadenopathy. Axillary  General Axillary Region: Bilateral - Description - No Localized lymphadenopathy. Femoral & Inguinal  Generalized Femoral & Inguinal Lymphatics: Left - Description - No Localized lymphadenopathy. Right - Description - No Localized lymphadenopathy.    Results [name] MD; 07/08/2014 10:01 AM) Procedures  Name Value Date Hemorrhoids Procedure Internal exam: Internal Hemorroids ( non-bleeding) Internal Hemorrhoids (bleeding) *Procedure: banded Other: The anatomy & physiology of the anorectal region was discussed. The pathophysiology of hemorrhoids and differential diagnosis was discussed. Natural history progression was discussed. I stressed the importance of a bowel regimen to have daily soft bowel movements to  minimize progression of disease.  The patient's symptoms are not adequately controlled. Therefore, I recommended banding to treat the hemorrhoids. I went over the technique, risks, benefits, and alternatives. Goals of post-operative recovery were discussed as well. Questions were answered. The patient expressed understanding & wished to proceed. The patient was positioned in the lateral decubitus position. Perianal & rectal examination was done. Using anoscopy, I ligated the hemorrhoids above the dentate line with banding. LEFT lateral and RIGHT posterior piles. The patient tolerated the procedure well. Educational handouts further explaining the pathology, treatment options, and bowel regimen were given as well.  Performed: 07/08/2014 9:34 AM   Assessment & Plan [name] MD; 07/08/2014 9:56 AM) EPIDIDYMITIS (604.90  N45.1) Impression: Swollen LEFT epididymis. Tender. Suspicious for epididymitis.  Detrol of ciprofloxacin. 500 mg by mouth twice a day for 7 days. If that does not resolve things, consider urology evaluation. Current Plans  Started Cipro 500MG , 1 (one) Tablet two times daily, #14, 07/08/2014, No Refill. BILATERAL INGUINAL HERNIA WITHOUT OBSTRUCTION OR GANGRENE, RECURRENCE NOT SPECIFIED (550.92  K40.20) Impression: LEFT greater than RIGHT inguinal hernias. LEFT inguinal hernia rather sensitive. He would benefit from surgery to repair these at some point. Would make sure his epididymitis and is hemorrhoid issues under better control.  He is interested in considering this. Give information and consider planning surgery in a few months. I would like to make sure that he does not have any epididymitis infection. Could consider ultrasound as well as not resolved with antibiotics. Would like urology input if not better with Cipro:  The anatomy & physiology of the abdominal wall and pelvic floor was discussed. The pathophysiology of hernias in the inguinal and pelvic  region was discussed. Natural history risks such as progressive enlargement, pain, incarceration & strangulation was discussed. Contributors to complications such as smoking, obesity, diabetes, prior surgery, etc were discussed.  I feel the risks of no intervention will lead to serious problems that outweigh the operative risks; therefore, I recommended surgery to reduce and repair the hernia. I explained laparoscopic techniques with possible need for an open approach. I noted usual use of mesh to patch and/or buttress hernia repair  Risks such as bleeding, infection, abscess, need for further treatment, heart attack, death, and other risks were discussed. I noted a good likelihood this will help address the problem. Goals of post-operative recovery were discussed as well. Possibility that this will not correct all symptoms was explained. I stressed the importance of low-impact activity,  aggressive pain control, avoiding constipation, & not pushing through pain to minimize risk of post-operative chronic pain or injury. Possibility of reherniation was discussed. We will work to minimize complications.  An educational handout further explaining the pathology & treatment options was given as well. Questions were answered. The patient expresses understanding & wishes to proceed with surge Current Plans Schedule for Surgery Pt Education - [facility] Hernia Post-Op HCI (Raihana Balderrama): discussed with patient and provided information. Pt Education - [facility] Pain Control (Merion Caton) The anatomy & physiology of the abdominal wall and pelvic floor was discussed. The pathophysiology of hernias in the inguinal and pelvic region was discussed. Natural history risks such as progressive enlargement, pain, incarceration, and strangulation was discussed. Contributors to complications such as smoking, obesity, diabetes, prior surgery, etc were discussed.  I feel the risks of no intervention will lead to serious problems that outweigh the  operative risks; therefore, I recommended surgery to reduce and repair the hernia. I explained laparoscopic techniques with possible need for an open approach. I noted usual use of mesh to patch and/or buttress hernia repair  Risks such as bleeding, infection, abscess, need for further treatment, heart attack, death, and other risks were discussed. I noted a good likelihood this will help address the problem. Goals of post-operative recovery were discussed as well. Possibility that this will not correct all symptoms was explained. I stressed the importance of low-impact activity, aggressive pain control, avoiding constipation, & not pushing through pain to minimize risk of post-operative chronic pain or injury. Possibility of reherniation was discussed. We will work to minimize complications.  An educational handout further explaining the pathology & treatment options was given as well. Questions were answered. The patient expresses understanding & wishes to proceed with surgery. PROLAPSED INTERNAL HEMORRHOIDS, GRADE 2 (455.2  K64.8) Impression: RIGHT posterior and LEFT lateral inflamed internal hemorrhoids. Rather sensitive. RIGHT anterior not so much. I offered banding. He was interested in proceeding. He tolerated banding rather well. Best band on LEFT lateral side. Still rather sensitive. Would hold off on doing [place] hemorrhoidal ligation unless fails banding 2 & is compliant on bowel regimen Current Plans Pt Education - [facility] Hemorrhoids (Verneice Caspers) Pt Education - [place] (Brookelynn Hamor) Pt Education - [facility] Rectal Surgery HCI (Ellisyn Icenhower): discussed with patient and provided information. HEMORRHOIDECTOMY, INTERNAL, RUBBER BAND LIGATION (46221) TOBACCO ABUSE (305.1  Z72.0) Impression: I think many of his issues would be improved if he stop smoking. Recommend gastritis a persistent chronic problem. Also makes management of pain worse. Current Plans STOP SMOKING!  We strongly recommend that you stop  smoking. Smoking increases the risk of surgery including infection in the form of an open wound, pus formation, abscess, hernia at an incision on the abdomen, etc. You have an increased risk of other MAJOR complications such as stroke, heart attack, forming clots in the leg and/or lungs, and death.  Smoking Cessation Quitting smoking is important to your health and has many advantages. However, it is not always easy to quit since nicotine is a very addictive drug. Often times, people try 3 times or more before being able to quit. This document explains the best ways for you to prepare to quit smoking. Quitting takes hard work and a lot of effort, but you can do it. ADVANTAGES OF QUITTING SMOKING  You will live longer, feel better, and live better.  Your body will feel the impact of quitting smoking almost immediately.  Within 20 minutes, blood pressure decreases. Your pulse returns to its  normal level.  After 8 hours, carbon monoxide levels in the blood return to normal. Your oxygen level increases.  After 24 hours, the chance of having a heart attack starts to decrease. Your breath, hair, and body stop smelling like smoke.  After 48 hours, damaged nerve endings begin to recover. Your sense of taste and smell improve.  After 72 hours, the body is virtually free of nicotine. Your bronchial tubes relax and breathing becomes easier.  After 2 to 12 weeks, lungs can hold more air. Exercise becomes easier and circulation improves.  The risk of having a heart attack, stroke, cancer, or lung disease is greatly reduced.  After 1 year, the risk of coronary heart disease is cut in half.  After 5 years, the risk of stroke falls to the same as a nonsmoker.  After 10 years, the risk of lung cancer is cut in half and the risk of other cancers decreases significantly.  After 15 years, the risk of coronary heart disease drops, usually to the level of a nonsmoker.  If you are pregnant, quitting smoking  will improve your chances of having a healthy baby.  The people you live with, especially any children, will be healthier.  You will have extra money to spend on things other than cigarettes. QUESTIONS TO THINK ABOUT BEFORE ATTEMPTING TO QUIT You may want to talk about your answers with your caregiver.  Why do you want to quit?  If you tried to quit in the past, what helped and what did not?  What will be the most difficult situations for you after you quit? How will you plan to handle them?  Who can help you through the tough times? Your family? Friends? A caregiver?  What pleasures do you get from smoking? What ways can you still get pleasure if you quit? Here are some questions to ask your caregiver:  How can you help me to be successful at quitting?  What medicine do you think would be best for me and how should I take it?  What should I do if I need more help?  What is smoking withdrawal like? How can I get information on withdrawal? GET READY  Set a quit date.  Change your environment by getting rid of all cigarettes, ashtrays, matches, and lighters in your home, car, or work. Do not let people smoke in your home.  Review your past attempts to quit. Think about what worked and what did not. GET SUPPORT AND ENCOURAGEMENT You have a better chance of being successful if you have help. You can get support in many ways.  Tell your family, friends, and co-workers that you are going to quit and need their support. Ask them not to smoke around you.  Get individual, group, or telephone counseling and support. Programs are available at [employer] and health centers. Call your local health department for information about programs in your area.  Spiritual beliefs and practices may help some smokers quit.  Download a "quit meter" on your computer to keep track of quit statistics, such as how long you have gone without smoking, cigarettes not smoked, and money saved.  Get a  self-help book about quitting smoking and staying off of tobacco. [place] yourself from urges to smoke. Talk to someone, go for a walk, or occupy your time with a task.  Change your normal routine. Take a different route to work. Drink tea instead of coffee. Eat breakfast in a different  place.  Reduce your stress. Take a hot bath, exercise, or read a book.  Plan something enjoyable to do every day. Reward yourself for not smoking.  Explore interactive web-based programs that specialize in helping you quit. GET MEDICINE AND USE IT CORRECTLY Medicines can help you stop smoking and decrease the urge to smoke. Combining medicine with the above behavioral methods and support can greatly increase your chances of successfully quitting smoking.  Nicotine replacement therapy helps deliver nicotine to your body without the negative effects and risks of smoking. Nicotine replacement therapy includes nicotine gum, lozenges, inhalers, nasal sprays, and skin patches. Some may be available over-the-counter and others require a prescription.  Antidepressant medicine helps people abstain from smoking, but how this works is unknown. This medicine is available by prescription.  Nicotinic receptor partial agonist medicine simulates the effect of nicotine in your brain. This medicine is available by prescription. Ask your caregiver for advice about which medicines to use and how to use them based on your health history. Your caregiver will tell you what side effects to look out for if you choose to be on a medicine or therapy. Carefully read the information on the package. Do not use any other product containing nicotine while using a nicotine replacement product. RELAPSE OR DIFFICULT SITUATIONS Most relapses occur within the first 3 months after quitting. Do not be discouraged if you start smoking again. Remember, most people try several times before finally quitting. You may have  symptoms of withdrawal because your body is used to nicotine. You may crave cigarettes, be irritable, feel very hungry, cough often, get headaches, or have difficulty concentrating. The withdrawal symptoms are only temporary. They are strongest when you first quit, but they will go away within 10 14 days. To reduce the chances of relapse, try to:  Avoid drinking alcohol. Drinking lowers your chances of successfully quitting.  Reduce the amount of caffeine you consume. Once you quit smoking, the amount of caffeine in your body increases and can give you symptoms, such as a rapid heartbeat, sweating, and anxiety.  Avoid smokers because they can make you want to smoke.  Do not let weight gain distract you. Many smokers will gain weight when they quit, usually less than 10 pounds. Eat a healthy diet and stay active. You can always lose the weight gained after you quit.  Find ways to improve your mood other than smoking. FOR MORE INFORMATION www.smokefree.gov   While it can be one of the most difficult things to do, the [facility] community has programs to help you stop. Consider talking with your primary care physician about options. Also, Smoking Cessation classes are available through the [facility]:  The smoking cessation program is a proven-effective program from the American Lung Association. The program is available for anyone 42 and older who currently smokes. The program lasts for 7 weeks and is 8 sessions. Each class will be approximately 1 1/2 hours. The program is every Tuesday. All classes are 12-1:30pm and same location.  Event Location Information: Location: Campus 2nd Floor Conference Room 2-037; located next to [facility] cross streets: [place] Entrance into the [facility] is adjacent to the [employer] main entrance. The conference room is located on the 2nd floor. Parking  Instructions: Visitor parking is adjacent to [employer] main entrance and the [place]   A smoking cessation program is also offered through the [facility]  [place]. Register online at ClickDebate.gl or call [phone] for more information. Tobacco cessation counseling is available at [facility]. Call [phone] for a free appointment. Tobacco cessation classes also are available through the [place] in [place]. For information, call [phone]. The Patient Education Network features videos on tobacco cessation. Please consult your listings in the center of this book to find instructions on how to access this resource. If you want more information, ask your nurse.      [name], M.D., F.A.C.S. Gastrointestinal and Minimally Invasive Surgery [facility] [address], [place], [place] [address] [phone] Main / Paging

## 2014-08-29 NOTE — Anesthesia Preprocedure Evaluation (Addendum)
Anesthesia Evaluation  Patient identified by MRN, date of birth, ID band Patient awake    Reviewed: Allergy & Precautions, H&P , NPO status , Patient's Chart, lab work & pertinent test results  Airway Mallampati: II  TM Distance: >3 FB Neck ROM: Full    Dental no notable dental hx. (+) Teeth Intact, Dental Advisory Given   Pulmonary Current Smoker,  breath sounds clear to auscultation  Pulmonary exam normal       Cardiovascular hypertension, Pt. on medications negative cardio ROS  Rhythm:Regular Rate:Normal     Neuro/Psych  Headaches, Anxiety Depression    GI/Hepatic PUD, GERD-  Medicated and Controlled,(+) Hepatitis -, C  Endo/Other  negative endocrine ROS  Renal/GU negative Renal ROS  negative genitourinary   Musculoskeletal   Abdominal   Peds  Hematology negative hematology ROS (+)   Anesthesia Other Findings   Reproductive/Obstetrics negative OB ROS                          Anesthesia Physical Anesthesia Plan  ASA: II  Anesthesia Plan: General   Post-op Pain Management:    Induction: Intravenous  Airway Management Planned: Oral ETT  Additional Equipment:   Intra-op Plan:   Post-operative Plan: Extubation in OR  Informed Consent: I have reviewed the patients History and Physical, chart, labs and discussed the procedure including the risks, benefits and alternatives for the proposed anesthesia with the patient or authorized representative who has indicated his/her understanding and acceptance.   Dental advisory given  Plan Discussed with: CRNA  Anesthesia Plan Comments:         Anesthesia Quick Evaluation

## 2014-08-29 NOTE — Op Note (Signed)
08/29/2014  9:25 AM  PATIENT:  Brandon Ali  49 y.o. male  Patient Care Team: Kerin Perna, NP as PCP - General (Internal Medicine) Milus Banister, MD as Consulting Physician (Gastroenterology) Michael Boston, MD as Consulting Physician (General Surgery) Marty Heck, MD as Consulting Physician (Gastroenterology)  PRE-OPERATIVE DIAGNOSIS:  Bilateral Inguinal Hernia  POST-OPERATIVE DIAGNOSIS:    Recurrent Left Inguinal Hernia Right Inguinal Hernia  PROCEDURE:  Procedure(s): LAPAROSCOPIC BILATERAL INGUINAL HERNIA REPAIRS (Left recurrent)  SURGEON:  Surgeon(s): Michael Boston, MD  ASSISTANT: RNFA   ANESTHESIA:   Regional ilioinguinal and genitofemoral and spermatic cord nerve blocks with GETA  EBL:  Total I/O In: 1000 [I.V.:1000] Out: -   Delay start of Pharmacological VTE agent (>24hrs) due to surgical blood loss or risk of bleeding:  no  DRAINS: NONE  SPECIMEN:  NONE  DISPOSITION OF SPECIMEN:  N/A  COUNTS:  YES  PLAN OF CARE: Discharge to home after PACU  PATIENT DISPOSITION:  PACU - hemodynamically stable.  INDICATION:   Active male s/p open LIH repair with recurnet hernia.  Probable RIH as well.  I recommended repair:  The anatomy & physiology of the abdominal wall and pelvic floor was discussed.  The pathophysiology of hernias in the inguinal and pelvic region was discussed.  Natural history risks such as progressive enlargement, pain, incarceration & strangulation was discussed.   Contributors to complications such as smoking, obesity, diabetes, prior surgery, etc were discussed.    I feel the risks of no intervention will lead to serious problems that outweigh the operative risks; therefore, I recommended surgery to reduce and repair the hernia.  I explained laparoscopic techniques with possible need for an open approach.  I noted usual use of mesh to patch and/or buttress hernia repair  Risks such as bleeding, infection, abscess, need for further treatment,  heart attack, death, and other risks were discussed.  I noted a good likelihood this will help address the problem.   Goals of post-operative recovery were discussed as well.  Possibility that this will not correct all symptoms was explained.  I stressed the importance of low-impact activity, aggressive pain control, avoiding constipation, & not pushing through pain to minimize risk of post-operative chronic pain or injury. Possibility of reherniation was discussed.  We will work to minimize complications.     An educational handout further explaining the pathology & treatment options was given as well.  Questions were answered.  The patient expresses understanding & wishes to proceed with surgery.  OR FINDINGS: L>R indirect inguinal hernias with moderate spermatic cord lipomas.  Mild left direct space hernia as well    DESCRIPTION:   The patient was identified & brought into the operating room. The patient was positioned supine with arms tucked. SCDs were active during the entire case. The patient underwent general anesthesia without any difficulty.  The abdomen was prepped and draped in a sterile fashion. The patient's bladder was emptied.  A Surgical Timeout confirmed our plan.  I made a transverse incision through the inferior umbilical fold.  I made a small transverse nick through the anterior rectus fascia contralateral to the inguinal hernia side and placed a 0-vicryl stitch through the fascia.  I placed a Hasson trocar into the preperitoneal plane.  Entry was clean.  We induced carbon dioxide insufflation. Camera inspection revealed no injury.  I used a 53mm angled scope to bluntly free the peritoneum off the infraumbilical anterior abdominal wall.  I created enough of a preperitoneal pocket to  place 75mm ports into the right & left mid-abdomen into this preperitoneal cavity.  I focused attention on the left side since that was the dominant hernia side.   I used blunt & focused sharp dissection to  free the peritoneum off the flank and down to the pubic rim.  I freed the anteriolateral bladder wall off the anteriolateral pelvic wall, sparing midline attachments.   I located a swath of peritoneum going into a hernia fascial defect at the direct space but especially the internal ring consistent with a pantaloon type inguinal hernia.  I gradually freed the peritoneal hernia sac off safely and reduced it into the preperitoneal space.  I freed the peritoneum off the spermatic vessels & vas deferens.  I freed peritoneum off the retroperitoneum along the psoas muscle.    I did reduce and morcellate and remove a moderate volume of spermatic cord lipomas.  There is no evidence of any obturator femoral hernias.  I checked & assured hemostasis.    I turned attention on the opposite side.  I did dissection in a similar, mirror-image fashion. The patient had an indirect inguinal hernia.  Did reduce and remove a few smaller spermatic cord lipomas       There were no breaches in peritoneum that required closure.  I chose 15x15 cm sheets of ultra-lightweight polypropylene mesh (Ultrapro), one for each side.  I cut a single sigmoid-shaped slit ~6cm from a corner of each mesh.  I placed the meshes into the preperitoneal space & laid them as overlapping diamonds such that at the inferior points, a 6x6 cm corner flap rested in the true anterolateral pelvis, covering the obturator & femoral foramina.   I allowed the bladder to fall back and help tuck the corners of the mesh in.  The medial corners overlapped each other across midline cephalad to the pubic rim.   This provided >2 inch coverage around the hernia.  Because the defects well covered and not particularly large, I did not need tacks to hold the mesh in place  I held the hernia sacs cephalad & evacuated carbon dioxide.  I closed the fascia  With absorbable suture.  I closed the skin using 4-0 monocryl stitch.  Sterile dressings were applied. The patient was  extubated & arrived in the PACU in stable condition..  I had discussed postoperative care with the patient in the holding area.   I did discuss operative findings and postoperative goals / instructions to family as well.  Instructions are written in the chart.  [name], M.D., F.A.C.S. Gastrointestinal and Minimally Invasive Surgery [facility] [address] [place], [place] [address] [phone] Main / Paging

## 2014-08-29 NOTE — Progress Notes (Signed)
Receiving report from [employer]

## 2014-08-29 NOTE — Progress Notes (Signed)
Pt received at 1140 from PACU via stretcher.  Assisted to bathroom and pt was able to void without difficulty.  He has voided more than once since arrival.  He has complained of pain but immediately falls asleep in chair.  Pt was medicated after ambulation to the bathroom for continued complaints of pain.  Made pt and family aware that he must stay for 30 minutes after administration of pain meds.  They voiced understanding and anticipate discharge about 1335. Family voiced understanding of discharge instructions and is comfortable taking pt home.  They have copies of instructions and prescription in hand.

## 2014-08-29 NOTE — Transfer of Care (Signed)
Immediate Anesthesia Transfer of Care Note  Patient: Brandon Ali  Procedure(s) Performed: Procedure(s): LAPAROSCOPIC BILATERAL INGUINAL HERNIA REPAIRS (Bilateral)  Patient Location: PACU  Anesthesia Type:General  Level of Consciousness: alert   Airway & Oxygen Therapy: Patient Spontanous Breathing and Patient connected to nasal cannula oxygen  Post-op Assessment: Report given to RN, Post -op Vital signs reviewed and stable and Patient moving all extremities X 4  Post vital signs: Reviewed and stable  Last Vitals:  Filed Vitals:   08/29/14 0941  BP: 180/96  Pulse: 77  Temp: 36.8 C  Resp: 20    Complications: No apparent anesthesia complications

## 2014-08-30 ENCOUNTER — Encounter (HOSPITAL_COMMUNITY): Payer: Self-pay | Admitting: Surgery

## 2014-08-30 NOTE — Anesthesia Postprocedure Evaluation (Signed)
  Anesthesia Post-op Note  Patient: Brandon Ali  Procedure(s) Performed: Procedure(s): LAPAROSCOPIC BILATERAL INGUINAL HERNIA REPAIRS (Bilateral)  Patient Location: PACU  Anesthesia Type:General  Level of Consciousness: awake and alert   Airway and Oxygen Therapy: Patient Spontanous Breathing  Post-op Pain: mild  Post-op Assessment: Post-op Vital signs reviewed  Post-op Vital Signs: Reviewed  Last Vitals:  Filed Vitals:   08/29/14 1317  BP: 156/98  Pulse: 78  Temp: 36.9 C  Resp: 20    Complications: No apparent anesthesia complications

## 2014-10-01 ENCOUNTER — Encounter: Payer: Self-pay | Admitting: Gastroenterology

## 2014-11-24 ENCOUNTER — Emergency Department (HOSPITAL_COMMUNITY)
Admission: EM | Admit: 2014-11-24 | Discharge: 2014-11-24 | Disposition: A | Discharge status: Home / Self Care | Payer: No Typology Code available for payment source | Attending: Emergency Medicine | Admitting: Emergency Medicine

## 2014-11-24 ENCOUNTER — Encounter (HOSPITAL_COMMUNITY): Payer: Self-pay | Admitting: *Deleted

## 2014-11-24 ENCOUNTER — Ambulatory Visit (HOSPITAL_COMMUNITY)
Admission: RE | Admit: 2014-11-24 | Discharge: 2014-11-24 | Disposition: A | Discharge status: Home / Self Care | Payer: No Typology Code available for payment source | Attending: Psychiatry | Admitting: Psychiatry

## 2014-11-24 DIAGNOSIS — K259 Gastric ulcer, unspecified as acute or chronic, without hemorrhage or perforation: Secondary | ICD-10-CM | POA: Insufficient documentation

## 2014-11-24 DIAGNOSIS — E559 Vitamin D deficiency, unspecified: Secondary | ICD-10-CM | POA: Insufficient documentation

## 2014-11-24 DIAGNOSIS — F1920 Other psychoactive substance dependence, uncomplicated: Secondary | ICD-10-CM

## 2014-11-24 DIAGNOSIS — F1320 Sedative, hypnotic or anxiolytic dependence, uncomplicated: Secondary | ICD-10-CM | POA: Insufficient documentation

## 2014-11-24 DIAGNOSIS — F1120 Opioid dependence, uncomplicated: Secondary | ICD-10-CM | POA: Insufficient documentation

## 2014-11-24 DIAGNOSIS — Z8739 Personal history of other diseases of the musculoskeletal system and connective tissue: Secondary | ICD-10-CM | POA: Insufficient documentation

## 2014-11-24 DIAGNOSIS — F329 Major depressive disorder, single episode, unspecified: Secondary | ICD-10-CM | POA: Insufficient documentation

## 2014-11-24 DIAGNOSIS — Z79899 Other long term (current) drug therapy: Secondary | ICD-10-CM | POA: Insufficient documentation

## 2014-11-24 DIAGNOSIS — K219 Gastro-esophageal reflux disease without esophagitis: Secondary | ICD-10-CM | POA: Insufficient documentation

## 2014-11-24 DIAGNOSIS — Z720 Tobacco use: Secondary | ICD-10-CM | POA: Insufficient documentation

## 2014-11-24 DIAGNOSIS — Z8619 Personal history of other infectious and parasitic diseases: Secondary | ICD-10-CM | POA: Insufficient documentation

## 2014-11-24 DIAGNOSIS — F419 Anxiety disorder, unspecified: Secondary | ICD-10-CM | POA: Insufficient documentation

## 2014-11-24 DIAGNOSIS — G8929 Other chronic pain: Secondary | ICD-10-CM | POA: Insufficient documentation

## 2014-11-24 LAB — BASIC METABOLIC PANEL
Anion gap: 7 (ref 5–15)
BUN: 10 mg/dL (ref 6–20)
CO2: 29 mmol/L (ref 22–32)
Calcium: 9 mg/dL (ref 8.9–10.3)
Chloride: 98 mmol/L — ABNORMAL LOW (ref 101–111)
Creatinine, Ser: 0.94 mg/dL (ref 0.61–1.24)
GFR calc Af Amer: >60 mL/min (ref >60)
GFR calc non Af Amer: >60 mL/min (ref >60)
Glucose, Bld: 120 mg/dL — ABNORMAL HIGH (ref 65–99)
Potassium: 4 mmol/L (ref 3.5–5.1)
Sodium: 134 mmol/L — ABNORMAL LOW (ref 135–145)

## 2014-11-24 LAB — CBC WITH DIFFERENTIAL/PLATELET
Basophils Absolute: 0 10*3/uL (ref 0.0–0.1)
Basophils Relative: 0 % (ref 0–1)
Eosinophils Absolute: 0 10*3/uL (ref 0.0–0.7)
Eosinophils Relative: 1 % (ref 0–5)
HCT: 46.1 % (ref 39.0–52.0)
Hemoglobin: 15.8 g/dL (ref 13.0–17.0)
Lymphocytes Relative: 52 % — ABNORMAL HIGH (ref 12–46)
Lymphs Abs: 2.2 10*3/uL (ref 0.7–4.0)
MCH: 26.1 pg (ref 26.0–34.0)
MCHC: 34.3 g/dL (ref 30.0–36.0)
MCV: 76.2 fL — ABNORMAL LOW (ref 78.0–100.0)
Monocytes Absolute: 0.3 10*3/uL (ref 0.1–1.0)
Monocytes Relative: 6 % (ref 3–12)
Neutro Abs: 1.7 10*3/uL (ref 1.7–7.7)
Neutrophils Relative %: 41 % — ABNORMAL LOW (ref 43–77)
Platelets: 226 10*3/uL (ref 150–400)
RBC: 6.05 MIL/uL — ABNORMAL HIGH (ref 4.22–5.81)
RDW: 14.9 % (ref 11.5–15.5)
WBC: 4.2 10*3/uL (ref 4.0–10.5)

## 2014-11-24 LAB — RAPID URINE DRUG SCREEN, HOSP PERFORMED
Amphetamines: NOT DETECTED
Barbiturates: NOT DETECTED
Benzodiazepines: POSITIVE — AB
Cocaine: NOT DETECTED
Opiates: POSITIVE — AB
Tetrahydrocannabinol: NOT DETECTED

## 2014-11-24 LAB — ETHANOL: Alcohol, Ethyl (B): <5 mg/dL (ref <5)

## 2014-11-24 NOTE — ED Notes (Signed)
Gerald Stabs, ED PA reports TTS is sending pt's information to [facility] to see if it would be possible to place pt there tonight. Sts pt may stay in triage without changing out to scrubs if triage room remains available while we wait for placement notification.

## 2014-11-24 NOTE — Discharge Instructions (Signed)
Go straight to [facility]

## 2014-11-24 NOTE — BH Assessment (Signed)
Assessment Note  Brandon Ali is an 49 y.o. male. 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  Pt reports he is using 90mg  oxycodone per day for the past 4 years.  Pt also using 1.5mg  xanax per day for past 4 years.  Pt does report withdrawal symptoms of shakes, sweats, diarhea.  Pt currently having shakes. Pt reports no recent periods of sobriety.  Pt was treated at [facility] in 2012 but did not follow up with aftercare and began to use again soon after.  Pt does report depression but denies any SI/HI/AV.    Axis I: Substance Abuse Axis II: Deferred Axis III:  Past Medical History  Diagnosis Date  . Hepatitis C   . Ruptured disk   . IBS (irritable bowel syndrome)   . Headache   . Anxiety   . Vitamin D deficiency   . Stomach ulcer   . Chronic back pain   . Depression   . GERD (gastroesophageal reflux disease)    Axis IV: addiction Axis V: 51-60 moderate symptoms  Past Medical History:  Past Medical History  Diagnosis Date  . Hepatitis C   . Ruptured disk   . IBS (irritable bowel syndrome)   . Headache   . Anxiety   . Vitamin D deficiency   . Stomach ulcer   . Chronic back pain   . Depression   . GERD (gastroesophageal reflux disease)     Past Surgical History  Procedure Laterality Date  . Back surgery    . Inguinal hernia repair Left     in [place]  . Multiple tooth extractions    . Inguinal hernia repair Bilateral 08/29/2014    Procedure: LAPAROSCOPIC BILATERAL INGUINAL HERNIA REPAIRS;  Surgeon: Michael Boston, MD;  Location: [facility];  Service: General;  Laterality: Bilateral;    Family History: No family history on file.  Social History:  reports that he has been smoking Cigarettes.  He has been smoking about 0.25 packs per day. He has never used smokeless tobacco. He reports that he uses illicit drugs (Oxycodone). He reports that he does not drink alcohol.  Additional Social History:  Alcohol / Drug Use Pain Medications: yes Prescriptions:  yes Over the Counter: pt denies History of alcohol / drug use?: Yes Negative Consequences of Use: Work / Youth worker, Museum/gallery curator Withdrawal Symptoms: Tremors, Sweats Substance #1 Name of Substance 1: oxycodone 1 - Age of First Use: 19 1 - Amount (size/oz): 200mg  1 - Frequency: daily 1 - Duration: 4 years 1 - Last Use / Amount: 7/17 90mg  Substance #2 Name of Substance 2: xanax 2 - Age of First Use: 52 2 - Amount (size/oz): 2mg  2 - Frequency: daily 2 - Duration: 4 years 2 - Last Use / Amount: 7/16, 1.5mg   CIWA:   COWS: Clinical Opiate Withdrawal Scale (COWS) Sweating: Flushed or Observable moistness on face Restlessness: Able to sit still Pupil Size: Pupils pinned or normal size for room light Bone or Joint Aches: Not present Runny Nose or Tearing: Not present GI Upset: No GI symptoms Tremor: Slight tremor observable Anxiety or Irritability: None Gooseflesh Skin: Skin is smooth  Allergies:  Allergies  Allergen Reactions  . Aspirin Swelling and Other (See Comments)    Foot swells  . Cabbage Other (See Comments)    GI Upset    Home Medications:  (Not in a hospital admission)  OB/GYN Status:  No LMP for male patient.  General Assessment Data Location of Assessment: [facility]  Assessment Services [facility] Assessment: In system Is this a Tele or Face-to-Face Assessment?: Face-to-Face Is this an Initial Assessment or a Re-assessment for this encounter?: Initial Assessment Marital status: Divorced Is patient pregnant?: No Pregnancy Status: No Living Arrangements: Other (Comment) (with ex-wife) Can pt return to current living arrangement?: Yes Admission Status: Voluntary Is patient capable of signing voluntary admission?: Yes Referral Source: Self/Family/Friend Insurance type: coventry pending     Crisis Care Plan Living Arrangements: Other (Comment) (with ex-wife) Name of Psychiatrist: none Name of Therapist: none     Risk to self with the past 6 months Suicidal Ideation:  No Has patient been a risk to self within the past 6 months prior to admission? : No Suicidal Intent: No Has patient had any suicidal intent within the past 6 months prior to admission? : No Is patient at risk for suicide?: No Suicidal Plan?: No Has patient had any suicidal plan within the past 6 months prior to admission? : No Access to Means: No What has been your use of drugs/alcohol within the last 12 months?: current prescription drug use Previous Attempts/Gestures: No Intentional Self Injurious Behavior: None Family Suicide History: No Recent stressful life event(s): Other (Comment) (addiction) Persecutory voices/beliefs?: No Depression: Yes Depression Symptoms: Despondent, Insomnia, Isolating, Loss of interest in usual pleasures, Fatigue Substance abuse history and/or treatment for substance abuse?: Yes  Risk to Others within the past 6 months Homicidal Ideation: No Does patient have any lifetime risk of violence toward others beyond the six months prior to admission? : No Thoughts of Harm to Others: No Current Homicidal Intent: No Current Homicidal Plan: No Access to Homicidal Means: No History of harm to others?: No Assessment of Violence: None Noted Does patient have access to weapons?: No Criminal Charges Pending?: No Does patient have a court date: No Is patient on probation?: No  Psychosis Hallucinations: None noted Delusions: None noted  Mental Status Report Appearance/Hygiene: Unremarkable Eye Contact: Good Motor Activity: Unremarkable Speech: Logical/coherent Level of Consciousness: Alert Mood: Anxious Affect: Appropriate to circumstance Anxiety Level: Moderate Judgement: Unimpaired Orientation: Person, Place, Time, Situation Obsessive Compulsive Thoughts/Behaviors: None  Cognitive Functioning Concentration: Normal Memory: Recent Intact, Remote Intact IQ: Average Insight: Fair Impulse Control: Poor Appetite: Fair Weight Loss: 0 Weight Gain:  0 Sleep: Decreased Total Hours of Sleep: 6 Vegetative Symptoms: None  ADLScreening [facility] Assessment Services) Patient's cognitive ability adequate to safely complete daily activities?: Yes Patient able to express need for assistance with ADLs?: Yes Independently performs ADLs?: Yes (appropriate for developmental age)  Prior Inpatient Therapy Prior Inpatient Therapy: Yes Prior Therapy Dates: 2012 Prior Therapy Facilty/Provider(s): [facility] Reason for Treatment: substance use  Prior Outpatient Therapy Prior Outpatient Therapy: No Does patient have an ACCT team?: No Does patient have Intensive In-House Services?  : No Does patient have [facility] services? : No Does patient have P4CC services?: No  ADL Screening (condition at time of admission) Patient's cognitive ability adequate to safely complete daily activities?: Yes Patient able to express need for assistance with ADLs?: Yes Independently performs ADLs?: Yes (appropriate for developmental age)       Abuse/Neglect Assessment (Assessment to be complete while patient is alone) Physical Abuse: Denies Verbal Abuse: Denies Sexual Abuse: Denies Exploitation of patient/patient's resources: Denies Self-Neglect: Denies     Regulatory affairs officer (For Healthcare) Does patient have an advance directive?: No Would patient like information on creating an advanced directive?: No - patient declined information    Additional Information 1:1 In Past 12 Months?: No CIRT Risk:  No Elopement Risk: No Does patient have medical clearance?: No     Disposition: Discussed pt with Lyda Jester at [facility] who agreed that pt be medically cleared at [facility].  TTS spoke with Gennette Pac RN at [facility], who agreed to accept pt for medical clearance.  Pt was with a friend and had transportation, so pt drove across the street for medical clearance. Disposition Initial Assessment Completed for this Encounter: Yes Disposition of Patient: Other dispositions (sent to [facility]  for medical clearance)  On Site Evaluation by:   Reviewed with Physician:    Joanne Chars 11/24/2014 5:31 PM

## 2014-11-24 NOTE — ED Provider Notes (Signed)
CSN: [id number]     Arrival date & time 11/24/14  1722 History   First MD Initiated Contact with Patient 11/24/14 1741     Chief Complaint  Patient presents with  . 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  The patient states that he has been abusing benzodiazepine and opiates for quite a while.  Patient states that he is not have any nausea, vomiting, weakness, dizziness, headache, blurred vision, back pain, neck pain, fever, cough, lightheadedness, near syncope or syncope.  The patient states that nothing seems make his condition, better or worse.  He states that he has been to drug detox before Past Medical History  Diagnosis Date  . Hepatitis C   . Ruptured disk   . IBS (irritable bowel syndrome)   . Headache   . Anxiety   . Vitamin D deficiency   . Stomach ulcer   . Chronic back pain   . Depression   . GERD (gastroesophageal reflux disease)    Past Surgical History  Procedure Laterality Date  . Back surgery    . Inguinal hernia repair Left     in [place]  . Multiple tooth extractions    . Inguinal hernia repair Bilateral 08/29/2014    Procedure: LAPAROSCOPIC BILATERAL INGUINAL HERNIA REPAIRS;  Surgeon: Michael Boston, MD;  Location: [facility];  Service: General;  Laterality: Bilateral;   No family history on file. History  Substance Use Topics  . Smoking status: Current Every Day Smoker -- 0.25 packs/day    Types: Cigarettes  . Smokeless tobacco: Never Used  . Alcohol Use: No    Review of Systems All other systems negative except as documented in the HPI. All pertinent positives and negatives as reviewed in the HPI.   Allergies  Pork-derived products; Aspirin; and Cabbage  Home Medications   Prior to Admission medications   Medication Sig Start Date End Date Taking? Authorizing  Provider  ALPRAZolam Duanne Moron) 0.5 MG tablet Take 0.5 mg by mouth 3 (three) times daily.    Yes Historical Provider, MD  Cholecalciferol (VITAMIN D) 2000 UNITS tablet Take 2,000 Units by mouth every morning.    Yes Historical Provider, MD  famotidine (PEPCID) 20 MG tablet Take 20 mg by mouth 2 (two) times daily as needed for heartburn or indigestion.   Yes Historical Provider, MD  hydrochlorothiazide (HYDRODIURIL) 25 MG tablet Take 25 mg by mouth every morning.   Yes Historical Provider, MD  omeprazole (PRILOSEC) 20 MG capsule Take 20 mg by mouth every morning.   Yes Historical Provider, MD  Oxycodone HCl 10 MG TABS Take 10 mg by mouth 2 (two) times daily.   Yes Historical Provider, MD  tamsulosin (FLOMAX) 0.4 MG CAPS capsule Take 0.4 mg by mouth daily with lunch.   Yes Historical Provider, MD  ibuprofen (ADVIL,MOTRIN) 600 MG tablet Take 1 tablet (600 mg total) by mouth every 6 (six) hours as needed for mild pain or moderate pain. Patient not taking: Reported on 11/24/2014 08/29/14   Michael Boston, MD  oxyCODONE (ROXICODONE) 15 MG immediate release tablet Take 1 tablet (15 mg total) by mouth 4 (four) times daily. Patient not taking: Reported on 11/24/2014 08/29/14   Michael Boston, MD   BP 137/86 mmHg  Pulse 60  Temp(Src) 98.1 F (36.7 C) (Oral)  Resp 14  SpO2 100% Physical Exam  Constitutional: He is oriented to person, place, and time.  He appears well-developed and well-nourished. No distress.  HENT:  Head: Normocephalic and atraumatic.  Eyes: Pupils are equal, round, and reactive to light.  Neck: Normal range of motion. Neck supple.  Cardiovascular: Normal rate, regular rhythm and normal heart sounds.  Exam reveals no gallop and no friction rub.   No murmur heard. Pulmonary/Chest: Effort normal and breath sounds normal. No respiratory distress.  Musculoskeletal: He exhibits no edema.  Neurological: He is alert and oriented to person, place, and time. He exhibits normal muscle tone.  Coordination normal.  Skin: Skin is warm and dry. No rash noted. No erythema.  Psychiatric: He has a normal mood and affect. His behavior is normal.  Nursing note and vitals reviewed.   ED Course  Procedures (including critical care time) Labs Review Labs Reviewed  URINE RAPID DRUG SCREEN, HOSP PERFORMED - Abnormal; Notable for the following:    Opiates POSITIVE (*)    Benzodiazepines POSITIVE (*)    All other components within normal limits  CBC WITH DIFFERENTIAL/PLATELET - Abnormal; Notable for the following:    RBC 6.05 (*)    MCV 76.2 (*)    Neutrophils Relative % 41 (*)    Lymphocytes Relative 52 (*)    All other components within normal limits  BASIC METABOLIC PANEL - Abnormal; Notable for the following:    Sodium 134 (*)    Chloride 98 (*)    Glucose, Bld 120 (*)    All other components within normal limits  ETHANOL   Patient has been accepted to [facility] he has not been suicidal or homicidal here in the emergency department   Dalia Heading, PA-C 11/24/14 2001  Dorie Rank, MD 11/29/14 [phone]

## 2014-11-24 NOTE — ED Notes (Signed)
Pt presents requesting detox from oxycodone (last use 12pm today), xanax (last use yesterday). Normally uses xanax 2mg  and 100-200mg  of oxycodone a day. Pt c/o abd pain and anxiety currently. Pt denies SI/HI. Pt reports hearing voices like his name being called but no one is around.

## 2014-11-27 ENCOUNTER — Emergency Department
Admission: EM | Admit: 2014-11-27 | Discharge: 2014-11-27 | Disposition: A | Discharge status: Home / Self Care | Payer: No Typology Code available for payment source | Attending: Emergency Medicine | Admitting: Emergency Medicine

## 2014-11-27 ENCOUNTER — Encounter: Payer: Self-pay | Admitting: Emergency Medicine

## 2014-11-27 DIAGNOSIS — I10 Essential (primary) hypertension: Secondary | ICD-10-CM | POA: Insufficient documentation

## 2014-11-27 DIAGNOSIS — F1123 Opioid dependence with withdrawal: Secondary | ICD-10-CM | POA: Insufficient documentation

## 2014-11-27 DIAGNOSIS — F1193 Opioid use, unspecified with withdrawal: Secondary | ICD-10-CM

## 2014-11-27 DIAGNOSIS — R197 Diarrhea, unspecified: Secondary | ICD-10-CM | POA: Insufficient documentation

## 2014-11-27 DIAGNOSIS — Z720 Tobacco use: Secondary | ICD-10-CM | POA: Insufficient documentation

## 2014-11-27 DIAGNOSIS — M6281 Muscle weakness (generalized): Secondary | ICD-10-CM | POA: Insufficient documentation

## 2014-11-27 DIAGNOSIS — K929 Disease of digestive system, unspecified: Secondary | ICD-10-CM | POA: Insufficient documentation

## 2014-11-27 DIAGNOSIS — Z79899 Other long term (current) drug therapy: Secondary | ICD-10-CM | POA: Insufficient documentation

## 2014-11-27 DIAGNOSIS — E869 Volume depletion, unspecified: Secondary | ICD-10-CM | POA: Insufficient documentation

## 2014-11-27 LAB — URINALYSIS COMPLETE WITH MICROSCOPIC (ARMC ONLY)
BACTERIA UA: NONE SEEN
Bilirubin Urine: NEGATIVE
Hgb urine dipstick: NEGATIVE
Ketones, ur: NEGATIVE mg/dL
LEUKOCYTES UA: NEGATIVE
Nitrite: NEGATIVE
PH: 6 (ref 5.0–8.0)
PROTEIN: NEGATIVE mg/dL
RBC / HPF: NONE SEEN RBC/hpf (ref 0–5)
SQUAMOUS EPITHELIAL / LPF: NONE SEEN
Specific Gravity, Urine: 1.002 — ABNORMAL LOW (ref 1.005–1.030)
WBC UA: NONE SEEN WBC/hpf (ref 0–5)

## 2014-11-27 LAB — BASIC METABOLIC PANEL
Anion gap: 8 (ref 5–15)
BUN: <5 mg/dL — ABNORMAL LOW (ref 6–20)
CO2: 25 mmol/L (ref 22–32)
Calcium: 8.9 mg/dL (ref 8.9–10.3)
Chloride: 101 mmol/L (ref 101–111)
Creatinine, Ser: 0.85 mg/dL (ref 0.61–1.24)
GFR calc Af Amer: >60 mL/min (ref >60)
GFR calc non Af Amer: >60 mL/min (ref >60)
Glucose, Bld: 205 mg/dL — ABNORMAL HIGH (ref 65–99)
Potassium: 3.5 mmol/L (ref 3.5–5.1)
SODIUM: 134 mmol/L — AB (ref 135–145)

## 2014-11-27 LAB — CBC
HEMATOCRIT: 44 % (ref 40.0–52.0)
Hemoglobin: 14.5 g/dL (ref 13.0–18.0)
MCH: 25.2 pg — ABNORMAL LOW (ref 26.0–34.0)
MCHC: 32.8 g/dL (ref 32.0–36.0)
MCV: 76.8 fL — ABNORMAL LOW (ref 80.0–100.0)
Platelets: 220 10*3/uL (ref 150–440)
RBC: 5.73 MIL/uL (ref 4.40–5.90)
RDW: 14.5 % (ref 11.5–14.5)
WBC: 3.7 10*3/uL — AB (ref 3.8–10.6)

## 2014-11-27 MED ORDER — ONDANSETRON HCL 4 MG/2ML IJ SOLN
4.0000 mg | INTRAMUSCULAR | Status: AC
  Administered 2014-11-27: 4 mg via INTRAVENOUS
  Filled 2014-11-27: qty 2

## 2014-11-27 MED ORDER — VITAMIN B-1 100 MG PO TABS
100.0000 mg | ORAL_TABLET | Freq: Once | ORAL | Status: AC
  Administered 2014-11-27: 100 mg via ORAL
  Filled 2014-11-27 (×2): qty 1

## 2014-11-27 MED ORDER — SODIUM CHLORIDE 0.9 % IV BOLUS (SEPSIS)
1000.0000 mL | INTRAVENOUS | Status: AC
  Administered 2014-11-27: 1000 mL via INTRAVENOUS

## 2014-11-27 MED ORDER — FOLIC ACID 1 MG PO TABS
1.0000 mg | ORAL_TABLET | ORAL | Status: AC
  Administered 2014-11-27: 1 mg via ORAL
  Filled 2014-11-27 (×3): qty 1

## 2014-11-27 MED ORDER — ADULT MULTIVITAMIN W/MINERALS CH
1.0000 | ORAL_TABLET | ORAL | Status: AC
  Administered 2014-11-27: 1 via ORAL
  Filled 2014-11-27: qty 1

## 2014-11-27 NOTE — Discharge Instructions (Signed)
Your labs were reassuring, as were your vital signs, and you have no evidence of dehydration.  However, since you feel badly and are going through detox at this time, we gave you 1 L of normal saline by IV as well as some vitamin repletion.  Please return directly to [facility] to continue your treatment.   Benzodiazepine Withdrawal  Benzodiazepines are a group of drugs that are prescribed for both short-term and long-term treatment of a variety of medical conditions. For some of these conditions, such as seizures and sudden and severe muscle spasms, they are used only for a few hours or a few days. For other conditions, such as anxiety, sleep problems, or frequent muscle spasms or to help prevent seizures, they are used for an extended period, usually weeks or months. Benzodiazepines work by changing the way your brain functions. Normally, chemicals in your brain called neurotransmitters send messages between your brain cells. The neurotransmitter that benzodiazepines affect is called gamma-aminobutyric acid (GABA). GABA sends out messages that have a calming effect on many of the functions of your brain. Benzodiazepines make these messages stronger and increase this calming effect. Short-term use of benzodiazepines usually does not cause problems when you stop taking the drugs. However, if you take benzodiazepines for a long time, your body can adjust to the drug and require more of it to produce the same effect (drug tolerance). Eventually, you can develop physical dependence on benzodiazepines, which is when you experience negative effects if your dosage of benzodiazepines is reduced or stopped too quickly. These negative effects are called symptoms of withdrawal. SYMPTOMS Symptoms of withdrawal may begin anytime within the first 10 days after you stop taking the benzodiazepine. They can last from several weeks up to a few months but usually are the worst between the first 10 to 14 days.  The actual symptoms  also vary, depending on the type of benzodiazepine you take. Possible symptoms include:  Anxiety.  Excitability.  Irritability.  Depression.  Mood swings.  Trouble sleeping.  Confusion.  Uncontrollable shaking (tremors).  Muscle weakness.  Seizures. DIAGNOSIS To diagnose benzodiazepine withdrawal, your caregiver will examine you for certain signs, such as:  Rapid heartbeat.  Rapid breathing.  Tremors.  High blood pressure.  Fever.  Mood changes. Your caregiver also may ask the following questions about your use of benzodiazepines:  What type of benzodiazepine did you take?  How much did you take each day?  How long did you take the drug?  When was the last time you took the drug?  Do you take any other drugs?  Have you had alcohol recently?  Have you had a seizure recently?  Have you lost consciousness recently?  Have you had trouble remembering recent events?  Have you had a recent increase in anxiety, irritability, or trouble sleeping? A drug test also may be administered. TREATMENT The treatment for benzodiazepine withdrawal can vary, depending on the type and severity of your symptoms, what type of benzodiazepine you have been taking, and how long you have been taking the benzodiazepine. Sometimes it is necessary for you to be treated in a hospital, especially if you are at risk of seizures.  Often, treatment includes a prescription for a long-acting benzodiazepine, the dosage of which is reduced slowly over a long period. This period could be several weeks or months. Eventually, your dosage will be reduced to a point that you can stop taking the drug, without experiencing withdrawal symptoms. This is called tapered withdrawal. Occasionally, minor symptoms  of withdrawal continue for a few days or weeks after you have completed a tapered withdrawal. SEEK IMMEDIATE MEDICAL CARE IF:  You have a seizure.  You develop a craving for drugs or  alcohol.  You begin to experience symptoms of withdrawal during your tapered withdrawal.  You become very confused.  You lose consciousness.  You have trouble breathing.  You think about hurting yourself or someone else. Document Released: 04/15/2011 Document Revised: 07/19/2011 Document Reviewed: 04/15/2011 [facility] Patient Information 2015 [place], [place]. This information is not intended to replace advice given to you by your health care provider. Make sure you discuss any questions you have with your health care provider.  Opioid Withdrawal Opioids are a group of narcotic drugs. They include the street drug heroin. They also include pain medicines, such as morphine, hydrocodone, oxycodone, and fentanyl. Opioid withdrawal is a group of characteristic physical and mental signs and symptoms. It typically occurs if you have been using opioids daily for several weeks or longer and stop using or rapidly decrease use. Opioid withdrawal can also occur if you have used opioids daily for a long time and are given a medicine to block the effect.  SIGNS AND SYMPTOMS Opioid withdrawal includes three or more of the following symptoms:   Depressed, anxious, or irritable mood.  Nausea or vomiting.  Muscle aches or spasms.   Watery eyes.   Runny nose.  Dilated pupils, sweating, or hairs standing on end.  Diarrhea or intestinal cramping.  Yawning.   Fever.  Increased blood pressure.  Fast pulse.  Restlessness or trouble sleeping. These signs and symptoms occur within several hours of stopping or reducing short-acting opioids, such as heroin. They can occur within 3 days of stopping or reducing long-acting opioids, such as methadone. Withdrawal begins within minutes of receiving a drug that blocks the effects of opioids, such as naltrexone or naloxone. DIAGNOSIS  Opioid use disorder is diagnosed by your health care provider. You will be asked about your symptoms, drug and alcohol  use, medical history, and use of medicines. A physical exam may be done. Lab tests may be ordered. Your health care provider may have you see a mental health professional.  TREATMENT  The treatment for opioid withdrawal is usually provided by medical doctors with special training in substance use disorders (addiction specialists). The following medicines may be included in treatment:  Opioids given in place of the abused opioid. They turn on opioid receptors in the brain and lessen or prevent withdrawal symptoms. They are gradually decreased (opioid substitution and taper).  Non-opioids that can lessen certain opioid withdrawal symptoms. They may be used alone or with opioid substitution and taper. Successful long-term recovery usually requires medicine, counseling, and group support. HOME CARE INSTRUCTIONS   Take medicines only as directed by your health care provider.  Check with your health care provider before starting new medicines.  Keep all follow-up visits as directed by your health care provider. SEEK MEDICAL CARE IF:  You are not able to take your medicines as directed.  Your symptoms get worse.  You relapse. SEEK IMMEDIATE MEDICAL CARE IF:  You have serious thoughts about hurting yourself or others.  You have a seizure.  You lose consciousness. Document Released: 04/29/2003 Document Revised: 09/10/2013 Document Reviewed: 05/09/2013 [facility] Patient Information 2015 [place], [place]. This information is not intended to replace advice given to you by your health care provider. Make sure you discuss any questions you have with your health care provider.

## 2014-11-27 NOTE — ED Provider Notes (Signed)
[facility] Emergency Department Provider Note  ____________________________________________  Time seen: Approximately 2:08 PM  I have reviewed the triage vital signs and the nursing notes.   HISTORY  Chief Complaint Dehydration    HPI Brandon Ali is a 49 y.o. male with a history that includes benzodiazepine and opiate addiction who presents from [facility] with concern for possible dehydration.  He reports that he has had gradual onset of moderate to severe generalized weakness and lightheadedness since he started at [facility] to try to eliminate his addiction to substances.  He states that he has had multiple episodes of diarrhea each day, which he states that he expected, but he believes as a result he is dehydrated.  He has had persistent nausea but no episodes of vomiting.  He denies chest pain, shortness of breath, abdominal pain.  He states that he is raw from having multiple loose stools.   Past Medical History  Diagnosis Date  . Hepatitis C   . Ruptured disk   . IBS (irritable bowel syndrome)   . Headache   . Anxiety   . Vitamin D deficiency   . Stomach ulcer   . Chronic back pain   . Depression   . GERD (gastroesophageal reflux disease)     Patient Active Problem List   Diagnosis Date Noted  . Bilateral inguinal hernia (BIH) 08/29/2014  . Acid reflux 04/22/2011  . Cervical pain 02/06/2011  . Chronic hepatitis C 02/06/2011  . Anxiety and depression 02/06/2011  . GASTRITIS 08/12/2009  . CONSTIPATION 08/12/2009  . NAUSEA 08/12/2009  . ABDOMINAL PAIN -GENERALIZED 08/12/2009  . GERD 04/10/2009  . ESSENTIAL HYPERTENSION, BENIGN 10/10/2008  . RIB PAIN 10/10/2008    Past Surgical History  Procedure Laterality Date  . Back surgery    . Inguinal hernia repair Left     in [place]  . Multiple tooth extractions    . Inguinal hernia repair Bilateral 08/29/2014    Procedure: LAPAROSCOPIC BILATERAL INGUINAL HERNIA REPAIRS;  Surgeon: Michael Boston, MD;  Location:  [facility];  Service: General;  Laterality: Bilateral;    Current Outpatient Rx  Name  Route  Sig  Dispense  Refill  . ALPRAZolam (XANAX) 0.5 MG tablet   Oral   Take 0.5 mg by mouth 3 (three) times daily.          . Cholecalciferol (VITAMIN D) 2000 UNITS tablet   Oral   Take 2,000 Units by mouth every morning.          . famotidine (PEPCID) 20 MG tablet   Oral   Take 20 mg by mouth 2 (two) times daily as needed for heartburn or indigestion.         . hydrochlorothiazide (HYDRODIURIL) 25 MG tablet   Oral   Take 25 mg by mouth every morning.         Marland Kitchen ibuprofen (ADVIL,MOTRIN) 600 MG tablet   Oral   Take 1 tablet (600 mg total) by mouth every 6 (six) hours as needed for mild pain or moderate pain. Patient not taking: Reported on 11/24/2014   40 tablet   2   . omeprazole (PRILOSEC) 20 MG capsule   Oral   Take 20 mg by mouth every morning.         Marland Kitchen oxyCODONE (ROXICODONE) 15 MG immediate release tablet   Oral   Take 1 tablet (15 mg total) by mouth 4 (four) times daily. Patient not taking: Reported on 11/24/2014   40 tablet  0   . Oxycodone HCl 10 MG TABS   Oral   Take 10 mg by mouth 2 (two) times daily.         . tamsulosin (FLOMAX) 0.4 MG CAPS capsule   Oral   Take 0.4 mg by mouth daily with lunch.           Allergies Pork-derived products; Aspirin; and Cabbage  No family history on file.  Social History History  Substance Use Topics  . Smoking status: Current Every Day Smoker -- 0.25 packs/day    Types: Cigarettes  . Smokeless tobacco: Never Used  . Alcohol Use: No    Review of Systems Constitutional: No fever/chills Eyes: No visual changes. ENT: No sore throat. Cardiovascular: Denies chest pain. Respiratory: Denies shortness of breath. Gastrointestinal: No abdominal pain.  Nausea, no vomiting.  Multiple episodes of diarrhea each day.  No constipation. Genitourinary: Negative for dysuria. Musculoskeletal: Negative for back pain. Skin:  Negative for rash. Neurological: Generalized weakness and lightheadedness, concern for dehydration  10-point ROS otherwise negative.  ____________________________________________   PHYSICAL EXAM:  VITAL SIGNS: ED Triage Vitals  Enc Vitals Group     BP 11/27/14 1325 110/68 mmHg     Pulse Rate 11/27/14 1325 93     Resp 11/27/14 1325 18     Temp 11/27/14 1325 98.2 F (36.8 C)     Temp Source 11/27/14 1325 Oral     SpO2 11/27/14 1325 98 %     Weight 11/27/14 1325 173 lb (78.472 kg)     Height 11/27/14 1325 5\' 8"  (1.727 m)     Head Cir --      Peak Flow --      Pain Score 11/27/14 1326 4     Pain Loc --      Pain Edu? --      Excl. in [place]? --     Constitutional: Alert and oriented.  Appears mildly uncomfortable but is in no acute distress. Eyes: Conjunctivae are normal. PERRL. EOMI. Head: Atraumatic. Nose: No congestion/rhinnorhea. Mouth/Throat: Mucous membranes are moist.  Oropharynx non-erythematous. Neck: No stridor.   Cardiovascular: Normal rate, regular rhythm. Grossly normal heart sounds.  Good peripheral circulation. Respiratory: Normal respiratory effort.  No retractions. Lungs CTAB. Gastrointestinal: Soft and nontender. No distention. No abdominal bruits. No CVA tenderness. Musculoskeletal: No lower extremity tenderness nor edema.  No joint effusions. Neurologic:  Normal speech and language. No gross focal neurologic deficits are appreciated.  Skin:  Skin is warm, dry and intact. No rash noted.  No piloerection. Psychiatric: Mood and affect are normal. Speech and behavior are normal.  ____________________________________________   LABS (all labs ordered are listed, but only abnormal results are displayed)  Labs Reviewed  BASIC METABOLIC PANEL - Abnormal; Notable for the following:    Sodium 134 (*)    Glucose, Bld 205 (*)    BUN <5 (*)    All other components within normal limits  CBC - Abnormal; Notable for the following:    WBC 3.7 (*)    MCV 76.8 (*)     MCH 25.2 (*)    All other components within normal limits  URINALYSIS COMPLETEWITH MICROSCOPIC ([facility] ONLY) - Abnormal; Notable for the following:    Color, Urine STRAW (*)    APPearance CLEAR (*)    Glucose, UA >500 (*)    Specific Gravity, Urine 1.002 (*)    All other components within normal limits   ____________________________________________  EKG  Not indicated ____________________________________________  RADIOLOGY  Not indicated  ____________________________________________   PROCEDURES  Procedure(s) performed: None  Critical Care performed: No ____________________________________________   INITIAL IMPRESSION / ASSESSMENT AND PLAN / ED COURSE  Pertinent labs & imaging results that were available during my care of the patient were reviewed by me and considered in my medical decision making (see chart for details).  The patient has no clinical signs or symptoms of dehydration, and his labs are reassuring, as are his vital signs.  However, given his persistent nausea, decreased by mouth intake, and profuse diarrhea, I believe he would feel better with some IV fluids.  He is also requesting "vitamins ", and given the situation just described above, I will give him thiamine, folate, and a multivitamin, in addition to 1 L of normal saline.  He does not have any acute/emergent medical conditions at this time.  ____________________________________________  FINAL CLINICAL IMPRESSION(S) / ED DIAGNOSES  Final diagnoses:  Opioid withdrawal  Diarrhea  Volume depletion, gastrointestinal loss      NEW MEDICATIONS STARTED DURING THIS VISIT:  New Prescriptions   No medications on file     Hinda Kehr, MD 11/27/14 1507

## 2014-11-27 NOTE — ED Notes (Signed)
Pt sent over for possible dehydration and needs iv fluids. Pt is from [facility].

## 2014-11-27 NOTE — ED Notes (Signed)
Pt sent over by [facility] for IV fluids, pt believes he is dehydrated and feels weak.
